# Patient Record
Sex: Male | Born: 2000 | Race: White | Hispanic: No | Marital: Single | Smoking: Current some day smoker
Health system: Southern US, Community
[De-identification: ages and names within clinical notes are randomized; demographics above are authoritative.]

## PROBLEM LIST (undated history)

## (undated) DIAGNOSIS — E119 Type 2 diabetes mellitus without complications: Secondary | ICD-10-CM

## (undated) DIAGNOSIS — J45909 Unspecified asthma, uncomplicated: Secondary | ICD-10-CM

## (undated) HISTORY — PX: WISDOM TOOTH EXTRACTION: SHX21

## (undated) HISTORY — PX: ROOT CANAL: SHX2363

---
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
## 2022-01-09 ENCOUNTER — Ambulatory Visit
Admission: EM | Admit: 2022-01-09 | Discharge: 2022-01-09 | Disposition: A | Discharge status: Home / Self Care | Payer: No Typology Code available for payment source | Attending: Physician Assistant | Admitting: Physician Assistant

## 2022-01-09 DIAGNOSIS — R112 Nausea with vomiting, unspecified: Secondary | ICD-10-CM

## 2022-01-09 MED ORDER — ONDANSETRON HCL 4 MG PO TABS: 4.0000 mg | ORAL_TABLET | Freq: Four times a day (QID) | ORAL | 0 refills | Status: AC

## 2022-01-09 NOTE — Discharge Instructions (Signed)
Can take Zofran as needed for nausea Recommend Imodium as needed for diarrhea Drink plenty of fluids, recommend gatorade zero Return if you develop new or worsening symptoms.

## 2022-01-09 NOTE — ED Triage Notes (Signed)
Pt c/o nausea, vomiting, diarrhea, abd pain   Onset ~ 4 days ago

## 2022-01-15 ENCOUNTER — Other Ambulatory Visit (HOSPITAL_COMMUNITY): Payer: Self-pay | Admitting: Psychiatry

## 2022-01-15 DIAGNOSIS — F331 Major depressive disorder, recurrent, moderate: Secondary | ICD-10-CM

## 2022-01-15 DIAGNOSIS — F411 Generalized anxiety disorder: Secondary | ICD-10-CM

## 2022-01-19 ENCOUNTER — Telehealth (HOSPITAL_COMMUNITY): Payer: No Typology Code available for payment source | Admitting: Psychiatry

## 2022-01-25 ENCOUNTER — Encounter: Payer: Self-pay | Admitting: Emergency Medicine

## 2022-01-25 ENCOUNTER — Ambulatory Visit
Admission: EM | Admit: 2022-01-25 | Discharge: 2022-01-25 | Disposition: A | Discharge status: Home / Self Care | Payer: No Typology Code available for payment source | Attending: Internal Medicine | Admitting: Internal Medicine

## 2022-01-25 DIAGNOSIS — R051 Acute cough: Secondary | ICD-10-CM | POA: Diagnosis present

## 2022-01-25 DIAGNOSIS — J069 Acute upper respiratory infection, unspecified: Secondary | ICD-10-CM | POA: Diagnosis present

## 2022-01-25 DIAGNOSIS — J029 Acute pharyngitis, unspecified: Secondary | ICD-10-CM | POA: Diagnosis present

## 2022-01-25 LAB — POCT RAPID STREP A (OFFICE): Rapid Strep A Screen: NEGATIVE

## 2022-01-25 MED ORDER — AMOXICILLIN-POT CLAVULANATE 875-125 MG PO TABS: 1.0000 | ORAL_TABLET | Freq: Two times a day (BID) | ORAL | 0 refills | Status: DC

## 2022-01-25 NOTE — Discharge Instructions (Signed)
Strep test was negative.  Throat culture is pending.  Will call if it is positive.  I am treating with an antibiotic to treat your upper respiratory infection and sore throat.  Please follow-up if symptoms persist or worsen.

## 2022-01-25 NOTE — ED Provider Notes (Signed)
[facility]    CSN: [id number] Arrival date & time: 01/25/22  1542      History   Chief Complaint Chief Complaint  Patient presents with   Sore Throat   Cough    congestion   Fatigue    HPI Ethan Montoya is a 21 y.o. male.   Patient presents with nasal congestion, cough, fatigue, sore throat.  Nasal congestion has been present for 1.5 weeks and other symptoms started about 4 days ago.  Patient reports that coworkers have tested positive for the flu.  He denies any known fevers at home.  Denies chest pain, shortness of breath, ear pain, nausea, vomiting, diarrhea, abdominal pain.  He has taken over-the-counter cold and flu medication with minimal improvement in symptoms.  Denies formal diagnosis of asthma but she does smoke cigarettes.   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 Authorizing Provider  amoxicillin-clavulanate (AUGMENTIN) 875-125 MG tablet Take 1 tablet by mouth every 12 (twelve) hours. 01/25/22  Yes Lavette Yankovich, Michele Rockers, FNP  Continuous Blood Gluc Receiver (DEXCOM G6 RECEIVER) DEVI 1 Device by Does not apply route daily as needed. 10/07/18   Hermenia Bers, NP  Continuous Blood Gluc Sensor (DEXCOM G6 SENSOR) MISC CHANGE SENSOR EVERY 10 DAYS 09/27/21    Hermenia Bers, NP  Continuous Blood Gluc Transmit (DEXCOM G6 TRANSMITTER) MISC CHANGE TRANSMITTER EVERY 90 DAYS 09/27/21   Hermenia Bers, NP  FLUoxetine (PROZAC) 10 MG capsule Take 1 capsule (10 mg total) by mouth daily. Take with 20 mg tab for a total of 30 mg daily 12/22/21 02/20/22  Vista Mink, MD  FLUoxetine (PROZAC) 20 MG capsule Take 1 capsule (20 mg total) by mouth daily. Take with 10 mg tab for a total of 30 mg daily 12/22/21 12/22/22  Vista Mink, MD  Insulin Disposable Pump (OMNIPOD 5 G6 INTRO, GEN 5,) KIT Inject 1 Device into the skin as directed. Change pod every 2 days. This will be a 30 day supply. Please fill for [facility] [id number] 10/18/20   Levon Hedger, MD  insulin glargine (LANTUS SOLOSTAR) 100 UNIT/ML Solostar Pen Inject up to 50 units per day as directed. Please keep on file for back up in case insulin pump malfunctions. 11/11/20   Levon Hedger, MD  insulin lispro (HUMALOG) 100 UNIT/ML injection Inject 300 units into pump every 48 hours 08/15/21   Hermenia Bers, NP  ondansetron (ZOFRAN) 4 MG tablet Take 1 tablet (4 mg total) by mouth every 6 (six) hours. 01/09/22   Ward, Lenise Arena, PA-C  cetirizine (ZYRTEC ALLERGY) 10 MG tablet Take 1 tablet (10 mg total) by mouth daily. Patient not taking: No sig reported 10/14/19 04/08/20  Jaynee Eagles, PA-C  ipratropium (ATROVENT) 0.06 % nasal spray Place 2 sprays into both nostrils 4 (four) times daily. Patient not taking: No sig reported 02/10/18 04/08/20  Ok Edwards, PA-C  NOVOPEN ECHO DEVI Use to inject insulin 4-5x day Patient not taking: No sig reported 11/07/18 04/08/20  Hermenia Bers, NP    Family History Family History  Problem Relation Age of Onset   Healthy Mother    Healthy Father     Social History Social History   Tobacco Use   Smoking status: Never    Passive exposure: Yes   Smokeless tobacco: Never  Vaping Use   Vaping Use: Never used  Substance Use Topics   Alcohol use: No   Drug use:  Never     Allergies   Patient has no known allergies.   Review of Systems Review of Systems Per HPI  Physical Exam Triage Vital Signs ED Triage Vitals  Enc Vitals Group     BP 01/25/22 1734 (!) 144/88     Pulse Rate 01/25/22 1734 82     Resp 01/25/22 1734 18     Temp 01/25/22 1742 98 F (36.7 C)     Temp src --      SpO2 01/25/22 1734 98 %     Weight --      Height --      Head Circumference --      Peak Flow --      Pain Score 01/25/22 1734 6     Pain Loc --      Pain Edu? --      Excl. in [place]? --    No data found.  Updated Vital Signs BP (!) 144/88   Pulse 82   Temp 98 F (36.7 C)   Resp 18   SpO2 98%   Visual Acuity Right Eye Distance:   Left Eye Distance:   Bilateral Distance:    Right Eye Near:   Left Eye Near:    Bilateral Near:     Physical Exam Constitutional:      General: He is not in acute distress.    Appearance: Normal appearance. He is not toxic-appearing or diaphoretic.  HENT:     Head: Normocephalic and atraumatic.     Right Ear: Tympanic membrane and ear canal normal.     Left Ear: Tympanic membrane and ear canal normal.     Nose: Congestion present.     Mouth/Throat:     Mouth: Mucous membranes are moist.     Pharynx: Posterior oropharyngeal erythema present. No pharyngeal swelling or oropharyngeal exudate.     Tonsils: No tonsillar exudate or tonsillar abscesses. 1+ on the right. 1+ on the left.  Eyes:     Extraocular Movements: Extraocular movements intact.     Conjunctiva/sclera: Conjunctivae normal.     Pupils: Pupils are equal, round, and reactive to light.  Cardiovascular:     Rate and Rhythm: Normal rate and regular rhythm.     Pulses: Normal pulses.     Heart sounds: Normal heart sounds.  Pulmonary:     Effort: Pulmonary effort is normal. No respiratory distress.     Breath sounds: Normal breath sounds. No stridor. No wheezing, rhonchi or rales.  Abdominal:     General: Abdomen is flat. Bowel sounds are normal.      Palpations: Abdomen is soft.  Musculoskeletal:        General: Normal range of motion.     Cervical back: Normal range of motion.  Skin:    General: Skin is warm and dry.  Neurological:     General: No focal deficit present.     Mental Status: He is alert and oriented to person, place, and time. Mental status is at baseline.  Psychiatric:        Mood and Affect: Mood normal.        Behavior: Behavior normal.      UC Treatments / Results  Labs (all labs ordered are listed, but only abnormal results are displayed) Labs Reviewed  CULTURE, GROUP A STREP [facility])  POCT RAPID STREP A (OFFICE)    EKG   Radiology No results found.  Procedures Procedures (including critical care time)  Medications Ordered in UC Medications - No data to display  Initial Impression / Assessment and Plan / UC Course  I have reviewed the triage vital signs and the nursing notes.  Pertinent labs & imaging results that were available during my care of the patient were reviewed by me and considered in my medical decision making (see chart for details).     Rapid strep is negative.  Throat culture pending.  Given duration of nasal congestion and acute tonsillitis on exam, will opt to treat with antibiotic for concern for secondary bacterial infection.  Discussed with patient concern that a viral illness could be cause of symptoms as well.  Do not think viral testing is necessary given duration of symptoms as it would not change treatment.  Discussed supportive care and symptom management with patient.  Discussed return precautions.  Patient verbalized understanding and was agreeable with plan. Final Clinical Impressions(s) / UC Diagnoses   Final diagnoses:  Acute upper respiratory infection  Sore throat  Acute cough     Discharge Instructions      Strep test was negative.  Throat culture is pending.  Will call if it is positive.  I am treating with an antibiotic to treat your upper respiratory  infection and sore throat.  Please follow-up if symptoms persist or worsen.     ED Prescriptions     Medication Sig Dispense Auth. Provider   amoxicillin-clavulanate (AUGMENTIN) 875-125 MG tablet Take 1 tablet by mouth every 12 (twelve) hours. 14 tablet [place], Michele Rockers, [place]      PDMP not reviewed this encounter.   Teodora Medici, [place] 01/25/22 [phone]

## 2022-01-25 NOTE — ED Triage Notes (Signed)
Pt is present today with c/o cough, congestion, sore throat, and fatigue. Pt states his cough started one week ago and sore throat started x2 days ago.

## 2022-01-29 LAB — CULTURE, GROUP A STREP (THRC)

## 2022-02-01 ENCOUNTER — Ambulatory Visit (INDEPENDENT_AMBULATORY_CARE_PROVIDER_SITE_OTHER): Payer: No Typology Code available for payment source | Admitting: Family

## 2022-02-01 ENCOUNTER — Encounter (INDEPENDENT_AMBULATORY_CARE_PROVIDER_SITE_OTHER): Payer: Self-pay

## 2022-02-01 NOTE — Progress Notes (Deleted)
Pediatric Endocrinology Diabetes Consultation Follow-up Visit  [name] 06-27-2000 [id number]  Chief Complaint: Follow-up type 1 diabetes   Ethan Cory, MD   HPI: Ethan Montoya  is a 21 y.o. male presenting for follow-up of type 1 diabetes. he is accompanied to this visit by his Mother .  Ethan Montoya presented to the ED on 07/20/16 with 2 days of increased polyuria/polydipsia and acute weight loss. He was on his second steroid burst for apparent contact dermatitis secondary to poison ivy. His mother has type 1 diabetes and recognized symptoms of diabetes. BG was 1,043 mg/dL without acidosis. There was >500 glucose in the urine but no ketones. He was admitted to the PICU for fluid resuscitation prior to initiation of insulin per hyperglycemia, non ketotic protocol.  In the PICU he had an observed seizure episode. He had a stat CT which was negative. They then thought perhaps seizure was a vagal response. He was transferred to the ward the next day. BG decreased with hydration to 400 and he was started on two component insulin with Lantus and Novolog at that time. 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   2. Since last visit to [facility] on 10/2021 , he has been well.  No ER visits or hospitalizations.  He has been busy with work and is considering going back to school. He will have hunting season coming up which will increase his activity.   Feels like diabetes has been going ok with Omnipod and Dexcom CGM. Blood sugars have been running higher now that it is not as hot out. He has been doing override boluses because he found his settings were not allowing him to give strong enough doses Hypoglycemia has been rare. No severe hypoglycemia.     Insulin regimen:  Omnipod 5   If using injections --> 5 units of Lantus. Novolog 150/50/15 plan  Basal (Max: 0.6 units/hr) 12AM 0.45                         Total: 10.8 units per day    Insulin to carbohydrate ratio (ICR)  12AM 25  9AM 17   12PM  15   6PM 12   9PM 15        Max Bolus: 10 units   Insulin Sensitivity Factor (ISF) 12AM 55                              Target BG & Correct Above BG 12AM 110                            Hypoglycemia: Able to feel low blood sugars.  No glucagon needed recently.  Blood glucose download: Did not bring meter  Omnipod Insulin pump downlaod    Med-alert ID: Not currently wearing. Injection sites: legs and arms  Annual labs due: 04/2022 Ophthalmology due: Due. Discussed today     3. ROS: Greater than 10 systems reviewed with pertinent positives listed in HPI, otherwise neg. Constitutional: Sleeping well.  Eyes: No changes in vision. No blurry vision  Ears/Nose/Mouth/Throat: No difficulty swallowing. Cardiovascular: No palpitations. No chest pain  Respiratory: No increased work of breathing. No SOB  Gastrointestinal: No constipation or diarrhea. No abdominal pain Genitourinary: No nocturia, no polyuria Neurologic: Normal sensation, no tremor Endocrine: No polydipsia.  No hyperpigmentation Psychiatric: Normal affect  Past Medical History:   Past Medical History:  Diagnosis Date   Asthma    Diabetes mellitus without complication ([place])     Medications:  Outpatient Encounter Medications as of 02/01/2022  Medication Sig   amoxicillin-clavulanate (AUGMENTIN) 875-125 MG tablet Take 1 tablet by mouth every 12 (twelve) hours.   Continuous Blood Gluc Receiver (DEXCOM G6 RECEIVER) DEVI 1 Device by Does not apply route daily as needed.   Continuous Blood Gluc Sensor (DEXCOM G6 SENSOR) MISC CHANGE SENSOR EVERY 10 DAYS   Continuous Blood Gluc Transmit (DEXCOM G6 TRANSMITTER) MISC CHANGE TRANSMITTER EVERY 90 DAYS   FLUoxetine (PROZAC) 10 MG capsule Take 1 capsule (10 mg total) by mouth daily. Take with 20 mg tab for a total of 30 mg daily   FLUoxetine (PROZAC) 20 MG capsule Take 1 capsule (20 mg total) by mouth daily. Take with 10 mg tab for a total of 30 mg daily   Insulin  Disposable Pump (OMNIPOD 5 G6 INTRO, GEN 5,) KIT Inject 1 Device into the skin as directed. Change pod every 2 days. This will be a 30 day supply. Please fill for [facility] [id number]   insulin glargine (LANTUS SOLOSTAR) 100 UNIT/ML Solostar Pen Inject up to 50 units per day as directed. Please keep on file for back up in case insulin pump malfunctions.   insulin lispro (HUMALOG) 100 UNIT/ML injection Inject 300 units into pump every 48 hours   ondansetron (ZOFRAN) 4 MG tablet Take 1 tablet (4 mg total) by mouth every 6 (six) hours.   [DISCONTINUED] cetirizine (ZYRTEC ALLERGY) 10 MG tablet Take 1 tablet (10 mg total) by mouth daily. (Patient not taking: No sig reported)   [DISCONTINUED] ipratropium (ATROVENT) 0.06 % nasal spray Place 2 sprays into both nostrils 4 (four) times daily. (Patient not taking: No sig reported)   [DISCONTINUED] NOVOPEN ECHO DEVI Use to inject insulin 4-5x day (Patient not taking: No sig reported)   No facility-administered encounter medications on file as of 02/01/2022.    Allergies: No Known Allergies  Surgical History: Past Surgical History:  Procedure Laterality Date   ROOT CANAL     WISDOM TOOTH EXTRACTION      Family History:  Mother has Type 1 diabetes diagnosed at age 19.    Social History: Lives with: Parents and sister   Graduated. [employer] freshman   Physical Exam:  There were no vitals filed for this visit.      There were no vitals taken for this visit. Body mass index: body mass index is unknown because there is no height or weight on file. Blood pressure %iles are not available for patients who are 18 years or older.  Ht Readings from Last 3 Encounters:  04/08/20 5' 11.26" (1.81 m) (75 %, Z= 0.67)*  01/08/20 5' 11.1" (1.806 m) (74 %, Z= 0.63)*  10/06/19 5' 10.87" (1.8 m) (72 %, Z= 0.57)*   * Growth percentiles are based on CDC (Boys, 2-20 Years) data.   Wt Readings from Last 3 Encounters:  11/02/21 152 lb 6.4 oz (69.1 kg) (46 %, Z=  -0.10)*  07/26/21 149 lb 12.8 oz (67.9 kg) (43 %, Z= -0.17)*  04/21/21 142 lb 6 oz (64.6 kg) (32 %, Z= -0.47)*   * Growth percentiles are based on CDC (Boys, 2-20 Years) data.   Physical Exam  General: Well developed, well nourished male in no acute distress. Head: Normocephalic, atraumatic.   Eyes:  Pupils equal and round. EOMI.  Sclera white.  No eye drainage.   Ears/Nose/Mouth/Throat: Nares patent, no nasal drainage.  Normal dentition, mucous membranes moist.  Neck: supple, no cervical lymphadenopathy, no thyromegaly Cardiovascular: regular rate, normal S1/S2, no murmurs Respiratory: No increased work of breathing.  Lungs clear to auscultation bilaterally.  No wheezes. Abdomen: soft, nontender, nondistended. Normal bowel sounds.  No appreciable masses  Extremities: warm, well perfused, cap refill < 2 sec.   Musculoskeletal: Normal muscle mass.  Normal strength Skin: warm, dry.  No rash or lesions. Neurologic: alert and oriented, normal speech, no tremor    Labs:  Results for orders placed or performed during the hospital encounter of 01/25/22  Culture, group A strep   Specimen: Throat  Result Value Ref Range   Specimen Description THROAT    Special Requests NONE    Culture      FEW STREPTOCOCCUS,BETA HEMOLYTIC NOT GROUP A Beta hemolytic streptococci are predictably susceptible to penicillin and other beta lactams. Susceptibility testing not routinely performed. Performed at [facility], [place] [address] [place], [place] [address]    Report Status 01/29/2022 FINAL   POCT rapid strep A  Result Value Ref Range   Rapid Strep A Screen Negative Negative     Assessment/Plan: Laval is a 21 y.o. male with type 1 diabetes recently started on Omnipod 5 insulin pump and Dexcom CGM. He is having more hyperglycemia, needs adjustments to insulin pump settings. Hemoglobin A1c is 7.4% which is higher then ADA goal of <7%.   1. Type 1 Diabetes  ([place]) 2. Hyperglycemia - Reviewed  insulin pump and CGM download. Discussed trends and patterns.  - Rotate pump sites to prevent scar tissue.  - bolus 15 minutes prior to eating to limit blood sugar spikes.  - Reviewed carb counting and importance of accurate carb counting.  - Discussed signs and symptoms of hypoglycemia. Always have glucose available.  - POCT glucose and hemoglobin A1c  - Reviewed growth chart.    3. Insulin pump titration  Basal (Max: 0.6 units/hr) 12AM 0.40 --> 0.45                         Total: 10.8 units per day    Insulin to carbohydrate ratio (ICR)  12AM 25  9AM 20 --> 17   12PM 18--> 15   6PM 13 --> 12   9PM 15        Max Bolus: 10 units   Insulin Sensitivity Factor (ISF) 12AM 65 --> 55                               Follow-up: 3 months.   LOS: >45 spent today reviewing the medical chart, counseling the patient/family, and documenting today's visit.    When a patient is on insulin, intensive monitoring of blood glucose levels is necessary to avoid hyperglycemia and hypoglycemia. Severe hyperglycemia/hypoglycemia can lead to hospital admissions and be life threatening.     Hermenia Bers,  FNP-C  Pediatric Specialist  [address] [place]  [place], [address]  Tele: [phone]

## 2022-03-02 ENCOUNTER — Ambulatory Visit (INDEPENDENT_AMBULATORY_CARE_PROVIDER_SITE_OTHER): Payer: No Typology Code available for payment source | Admitting: Family

## 2022-03-02 ENCOUNTER — Encounter (INDEPENDENT_AMBULATORY_CARE_PROVIDER_SITE_OTHER): Payer: Self-pay | Admitting: Family

## 2022-03-02 VITALS — BP 116/70 | HR 86 | Wt 147.0 lb

## 2022-03-02 DIAGNOSIS — E1065 Type 1 diabetes mellitus with hyperglycemia: Secondary | ICD-10-CM | POA: Diagnosis not present

## 2022-03-02 DIAGNOSIS — Z4681 Encounter for fitting and adjustment of insulin pump: Secondary | ICD-10-CM

## 2022-03-02 LAB — POCT GLUCOSE (DEVICE FOR HOME USE): POC Glucose: 127 mg/dl — AB (ref 70–99)

## 2022-03-02 LAB — POCT GLYCOSYLATED HEMOGLOBIN (HGB A1C): Hemoglobin A1C: 7 % — AB (ref 4.0–5.6)

## 2022-03-02 NOTE — Patient Instructions (Signed)
Insulin to carbohydrate ratio (ICR)  12AM 25  9AM 17   11PM 15 --> 20   5PM 12   9PM 15 --> 12        Max Bolus: 14 units  It was a pleasure seeing you in clinic today. Please do not hesitate to contact me if you have questions or concerns.   Please sign up for MyChart. This is a communication tool that allows you to send an email directly to me. This can be used for questions, prescriptions and blood sugar reports. We will also release labs to you with instructions on MyChart. Please do not use MyChart if you need immediate or emergency assistance. Ask our wonderful front office staff if you need assistance.

## 2022-03-02 NOTE — Progress Notes (Signed)
Pediatric Endocrinology Diabetes Consultation Follow-up Visit  Ethan Montoya 12-11-2000 [id number]  Chief Complaint: Follow-up type 1 diabetes   Alba Cory, MD   HPI: Ethan Montoya  is a 22 y.o. male presenting for follow-up of type 1 diabetes. he is accompanied to this visit by his Mother .  Ethan Montoya presented to the ED on 07/20/16 with 2 days of increased polyuria/polydipsia and acute weight loss. He was on his second steroid burst for apparent contact dermatitis secondary to poison ivy. His mother has type 1 diabetes and recognized symptoms of diabetes. BG was 1,043 mg/dL without acidosis. There was >500 glucose in the urine but no ketones. He was admitted to the PICU for fluid resuscitation prior to initiation of insulin per hyperglycemia, non ketotic protocol.  In the PICU he had an observed seizure episode. He had a stat CT which was negative. They then thought perhaps seizure was a vagal response. He was transferred to the ward the next day. BG decreased with hydration to 400 and he was started on two component insulin with Lantus and Novolog at that time. 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   2. Since last visit to [facility] on 10/2021 , he has been well.  No ER visits or hospitalizations.  Using Omnipod insulin pump and Dexcom CGM. Reports both have been working well. He boluses before eating most of the time. Carb intake is usually 45-90 grams per meal. He cuts back carbs that he enters at lunch to prevent hypoglycemia. No severe hypoglycemia, no glucagon needed.   Concerns:  - Lows after lunch.   Insulin regimen:  Omnipod 5   If using injections --> 5 units of Lantus. Novolog 150/50/15 plan  Basal (Max: 0.6 units/hr) 12AM 0.45                         Total: 10.8 units per day    Insulin to carbohydrate ratio (ICR)  12AM 25  9AM 17   12PM 15   6PM 12   9PM 15        Max Bolus: 14 units   Insulin Sensitivity Factor (ISF) 12AM 55                               Target BG & Correct Above BG 12AM 110                            Hypoglycemia: Able to feel low blood sugars.  No glucagon needed recently.  Blood glucose download: Did not bring meter  Omnipod Insulin pump downlaod    Med-alert ID: Not currently wearing. Injection sites: legs and arms  Annual labs due: 04/2022 Ophthalmology due: Due. Discussed today     3. ROS: Greater than 10 systems reviewed with pertinent positives listed in HPI, otherwise neg. Constitutional: Sleeping well.  Eyes: No changes in vision. No blurry vision  Ears/Nose/Mouth/Throat: No difficulty swallowing. Cardiovascular: No palpitations. No chest pain  Respiratory: No increased work of breathing. No SOB  Gastrointestinal: No constipation or diarrhea. No abdominal pain Genitourinary: No nocturia, no polyuria Neurologic: Normal sensation, no tremor Endocrine: No polydipsia.  No hyperpigmentation Psychiatric: Normal affect  Past Medical History:   Past Medical History:  Diagnosis Date   Asthma    Diabetes mellitus without complication (Ethan Montoya)     Medications:  Outpatient Encounter Medications as of 03/02/2022  Medication Sig   Continuous Blood Gluc Receiver (DEXCOM G6 RECEIVER) DEVI 1 Device by Does not apply route daily as needed.   Continuous Blood Gluc Sensor (DEXCOM G6 SENSOR) MISC CHANGE SENSOR EVERY 10 DAYS   Continuous Blood Gluc Transmit (DEXCOM G6 TRANSMITTER) MISC CHANGE TRANSMITTER EVERY 90 DAYS   FLUoxetine (PROZAC) 20 MG capsule Take 1 capsule (20 mg total) by mouth daily. Take with 10 mg tab for a total of 30 mg daily   Insulin Disposable Pump (OMNIPOD 5 G6 INTRO, GEN 5,) KIT Inject 1 Device into the skin as directed. Change pod every 2 days. This will be a 30 day supply. Please fill for Ethan Montoya [id number]   insulin lispro (HUMALOG) 100 UNIT/ML injection Inject 300 units into pump every 48 hours   amoxicillin-clavulanate (AUGMENTIN) 875-125 MG tablet Take 1 tablet by mouth every 12 (twelve)  hours. (Patient not taking: Reported on 03/02/2022)   FLUoxetine (PROZAC) 10 MG capsule Take 1 capsule (10 mg total) by mouth daily. Take with 20 mg tab for a total of 30 mg daily   insulin glargine (LANTUS SOLOSTAR) 100 UNIT/ML Solostar Pen Inject up to 50 units per day as directed. Please keep on file for back up in case insulin pump malfunctions. (Patient not taking: Reported on 03/02/2022)   ondansetron (ZOFRAN) 4 MG tablet Take 1 tablet (4 mg total) by mouth every 6 (six) hours. (Patient not taking: Reported on 03/02/2022)   [DISCONTINUED] cetirizine (ZYRTEC ALLERGY) 10 MG tablet Take 1 tablet (10 mg total) by mouth daily. (Patient not taking: No sig reported)   [DISCONTINUED] ipratropium (ATROVENT) 0.06 % nasal spray Place 2 sprays into both nostrils 4 (four) times daily. (Patient not taking: No sig reported)   [DISCONTINUED] NOVOPEN ECHO DEVI Use to inject insulin 4-5x day (Patient not taking: No sig reported)   No facility-administered encounter medications on file as of 03/02/2022.    Allergies: No Known Allergies  Surgical History: Past Surgical History:  Procedure Laterality Date   ROOT CANAL     WISDOM TOOTH EXTRACTION      Family History:  Mother has Type 1 diabetes diagnosed at age 23.    Social History: Lives with: Parents and sister   Graduated. Works as a Actor    Physical Exam:  Vitals:   03/02/22 0935  BP: 116/70  Pulse: 86  Weight: 147 lb (66.7 kg)     BP 116/70   Pulse 86   Wt 147 lb (66.7 kg)   BMI 20.35 kg/m  Body mass index: body mass index is 20.35 kg/m. Growth %ile SmartLinks can only be used for patients less than 24 years old.  Ht Readings from Last 3 Encounters:  04/08/20 5' 11.26" (1.81 m) (75 %, Z= 0.67)*  01/08/20 5' 11.1" (1.806 m) (74 %, Z= 0.63)*  10/06/19 5' 10.87" (1.8 m) (72 %, Z= 0.57)*   * Growth percentiles are based on CDC (Boys, 2-20 Years) data.   Wt Readings from Last 3 Encounters:  03/02/22 147 lb (66.7 kg)  11/02/21  152 lb 6.4 oz (69.1 kg) (46 %, Z= -0.10)*  07/26/21 149 lb 12.8 oz (67.9 kg) (43 %, Z= -0.17)*   * Growth percentiles are based on CDC (Boys, 2-20 Years) data.   Physical Exam  General: Well developed, well nourished male in no acute distress. Head: Normocephalic, atraumatic.   Eyes:  Pupils equal and round. EOMI.  Sclera white.  No eye drainage.   Ears/Nose/Mouth/Throat: Nares patent, no nasal drainage.  Normal dentition, mucous membranes moist.  Neck: supple, no cervical lymphadenopathy, no thyromegaly Cardiovascular: regular rate, normal S1/S2, no murmurs Respiratory: No increased work of breathing.  Lungs clear to auscultation bilaterally.  No wheezes. Abdomen: soft, nontender, nondistended. Normal bowel sounds.  No appreciable masses  Extremities: warm, well perfused, cap refill < 2 sec.   Musculoskeletal: Normal muscle mass.  Normal strength Skin: warm, dry.  No rash or lesions. Neurologic: alert and oriented, normal speech, no tremor    Labs:  Results for orders placed or performed in visit on 03/02/22  POCT glycosylated hemoglobin (Hb A1C)  Result Value Ref Range   Hemoglobin A1C 7.0 (A) 4.0 - 5.6 %   HbA1c POC (<> result, manual entry)     HbA1c, POC (prediabetic range)     HbA1c, POC (controlled diabetic range)    POCT Glucose (Device for Home Use)  Result Value Ref Range   Glucose Fasting, POC     POC Glucose 127 (A) 70 - 99 mg/dl     Assessment/Plan: Ethan Montoya is a 21 y.o. male with type 1 diabetes recently started on Omnipod 5 insulin pump and Dexcom CGM. Will reduce his carb ratio at lunch to reduce post prandial hyperglycemia. Hemoglobin A1c has improved to 7%. His time in target range is 63%.   1. Type 1 Diabetes  ([place]) 2. Hyperglycemia - Reviewed insulin pump and CGM download. Discussed trends and patterns.  - Rotate pump sites to prevent scar tissue.  - bolus 15 minutes prior to eating to limit blood sugar spikes.  - Reviewed carb counting and importance of  accurate carb counting.  - Discussed signs and symptoms of hypoglycemia. Always have glucose available.  - POCT glucose and hemoglobin A1c  - Reviewed growth chart.  - Discussed update to Omnipod system with new app soon to be released to his cell phone.   3. Insulin pump titration   Insulin to carbohydrate ratio (ICR)  12AM 25  9AM 17   11PM 15 --> 20   5PM 12   9PM 15 --> 12        Max Bolus: 14 units   Follow-up: 3 months.   LOS: >40  spent today reviewing the medical chart, counseling the patient/family, and documenting today's visit.  When a patient is on insulin, intensive monitoring of blood glucose levels is necessary to avoid hyperglycemia and hypoglycemia. Severe hyperglycemia/hypoglycemia can lead to [facility] admissions and be life threatening.     Hermenia Bers,  FNP-C  Pediatric Specialist  [address] [place]  [place], [address]  Tele: [phone]

## 2022-03-16 ENCOUNTER — Encounter (HOSPITAL_COMMUNITY): Payer: No Typology Code available for payment source | Admitting: Psychiatry

## 2022-03-16 ENCOUNTER — Encounter (HOSPITAL_COMMUNITY): Payer: Self-pay

## 2022-03-16 NOTE — Progress Notes (Signed)
This encounter was created in error - please disregard.

## 2022-04-26 ENCOUNTER — Other Ambulatory Visit (HOSPITAL_COMMUNITY): Payer: Self-pay | Admitting: Psychiatry

## 2022-04-26 DIAGNOSIS — F331 Major depressive disorder, recurrent, moderate: Secondary | ICD-10-CM

## 2022-04-26 DIAGNOSIS — F411 Generalized anxiety disorder: Secondary | ICD-10-CM

## 2022-04-30 ENCOUNTER — Other Ambulatory Visit (HOSPITAL_COMMUNITY): Payer: Self-pay | Admitting: Psychiatry

## 2022-04-30 DIAGNOSIS — F331 Major depressive disorder, recurrent, moderate: Secondary | ICD-10-CM

## 2022-06-01 ENCOUNTER — Ambulatory Visit (INDEPENDENT_AMBULATORY_CARE_PROVIDER_SITE_OTHER): Payer: No Typology Code available for payment source | Admitting: Family

## 2022-06-01 ENCOUNTER — Encounter (INDEPENDENT_AMBULATORY_CARE_PROVIDER_SITE_OTHER): Payer: Self-pay | Admitting: Family

## 2022-06-01 VITALS — BP 118/72 | HR 84 | Wt 142.8 lb

## 2022-06-01 DIAGNOSIS — E109 Type 1 diabetes mellitus without complications: Secondary | ICD-10-CM

## 2022-06-01 DIAGNOSIS — Z9641 Presence of insulin pump (external) (internal): Secondary | ICD-10-CM

## 2022-06-01 DIAGNOSIS — E1065 Type 1 diabetes mellitus with hyperglycemia: Secondary | ICD-10-CM

## 2022-06-01 LAB — POCT GLYCOSYLATED HEMOGLOBIN (HGB A1C): HbA1c POC (<> result, manual entry): 7.1 % (ref 4.0–5.6)

## 2022-06-01 LAB — POCT GLUCOSE (DEVICE FOR HOME USE): Glucose Fasting, POC: 111 mg/dL — AB (ref 70–99)

## 2022-06-01 MED ORDER — DEXCOM G6 SENSOR MISC: 1 refills | Status: DC

## 2022-06-01 NOTE — Progress Notes (Signed)
Pediatric Endocrinology Diabetes Consultation Follow-up Visit  NAQUAN GARMAN [birth year] [id number]  Chief Complaint: Follow-up type 1 diabetes   Velvet Bathe, MD   HPI: Wilfred  is a 22 y.o. male presenting for follow-up of type 1 diabetes. he is accompanied to this visit by his Mother .  1. Seab presented to the ED on 07/20/16 with 2 days of increased polyuria/polydipsia and acute weight loss. He was on his second steroid burst for apparent contact dermatitis secondary to poison ivy. His mother has type 1 diabetes and recognized symptoms of diabetes. BG was 1,043 mg/dL without acidosis. There was >500 glucose in the urine but no ketones. He was admitted to the PICU for fluid resuscitation prior to initiation of insulin per hyperglycemia, non ketotic protocol.  In the PICU he had an observed seizure episode. He had a stat CT which was negative. They then thought perhaps seizure was a vagal response. He was transferred to the ward the next day. BG decreased with hydration to 400 and he was started on two component insulin with Lantus and Novolog at that time. 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   2. Since last visit to [facility] on 02/2022 , he has been well.  No ER visits or hospitalizations.  He stopped Prozac for about 1 month and reports he struggled with depression and anxiety while being off of it which affected his diabetes. He restarted about 1 week ago. Using Omnipod insulin pump and Dexcom G6. Rarely has pod failures. He has not been bolusing often. Blood sugars have been running higher. Hypoglycemia has been rare, he is able to feel symptoms when low.    Insulin regimen:  Omnipod 5   If using injections --> 10 units of Lantus. Novolog 150/50/15 plan  Basal (Max: 0.6 units/hr) 12AM 0.45                         Total: 10.8 units per day    Insulin to carbohydrate ratio (ICR)  12AM 25  9AM 17   11PM 20   5PM 12   9PM 12        Max Bolus: 14 units   Insulin  Sensitivity Factor (ISF) 12AM 55                              Target BG & Correct Above BG 12AM 110                            Hypoglycemia: Able to feel low blood sugars.  No glucagon needed recently.  Blood glucose download: Did not bring meter  Omnipod Insulin pump downlaod    Med-alert ID: Not currently wearing. Injection sites: legs and arms  Annual labs due: 04/2022--> ordered  Ophthalmology due: Due. Discussed today     3. ROS: Greater than 10 systems reviewed with pertinent positives listed in HPI, otherwise neg. Constitutional: Sleeping well. 5 lbs weight loss.  Eyes: No changes in vision. No blurry vision  Ears/Nose/Mouth/Throat: No difficulty swallowing. Cardiovascular: No palpitations. No chest pain  Respiratory: No increased work of breathing. No SOB  Gastrointestinal: No constipation or diarrhea. No abdominal pain Genitourinary: No nocturia, no polyuria Neurologic: Normal sensation, no tremor Endocrine: No polydipsia.  No hyperpigmentation Psychiatric: Normal affect  Past Medical History:   Past Medical History:  Diagnosis Date   Asthma    Diabetes  mellitus without complication     Medications:  Outpatient Encounter Medications as of 06/01/2022  Medication Sig   Continuous Blood Gluc Receiver (DEXCOM G6 RECEIVER) DEVI 1 Device by Does not apply route daily as needed.   Continuous Blood Gluc Sensor (DEXCOM G6 SENSOR) MISC CHANGE SENSOR EVERY 10 DAYS   Continuous Blood Gluc Transmit (DEXCOM G6 TRANSMITTER) MISC CHANGE TRANSMITTER EVERY 90 DAYS   FLUoxetine (PROZAC) 10 MG capsule Take 1 capsule (10 mg total) by mouth daily. Take with 20 mg tab for a total of 30 mg daily   FLUoxetine (PROZAC) 20 MG capsule Take 1 capsule (20 mg total) by mouth daily. Take with 10 mg tab for a total of 30 mg daily   Insulin Disposable Pump (OMNIPOD 5 G6 INTRO, GEN 5,) KIT Inject 1 Device into the skin as directed. Change pod every 2 days. This will be a 30 day supply.  Please fill for [facility] [id number]   insulin lispro (HUMALOG) 100 UNIT/ML injection Inject 300 units into pump every 48 hours   amoxicillin-clavulanate (AUGMENTIN) 875-125 MG tablet Take 1 tablet by mouth every 12 (twelve) hours. (Patient not taking: Reported on 03/02/2022)   insulin glargine (LANTUS SOLOSTAR) 100 UNIT/ML Solostar Pen Inject up to 50 units per day as directed. Please keep on file for back up in case insulin pump malfunctions. (Patient not taking: Reported on 03/02/2022)   ondansetron (ZOFRAN) 4 MG tablet Take 1 tablet (4 mg total) by mouth every 6 (six) hours. (Patient not taking: Reported on 03/02/2022)   [DISCONTINUED] cetirizine (ZYRTEC ALLERGY) 10 MG tablet Take 1 tablet (10 mg total) by mouth daily. (Patient not taking: No sig reported)   [DISCONTINUED] ipratropium (ATROVENT) 0.06 % nasal spray Place 2 sprays into both nostrils 4 (four) times daily. (Patient not taking: No sig reported)   [DISCONTINUED] NOVOPEN ECHO DEVI Use to inject insulin 4-5x day (Patient not taking: No sig reported)   No facility-administered encounter medications on file as of 06/01/2022.    Allergies: No Known Allergies  Surgical History: Past Surgical History:  Procedure Laterality Date   ROOT CANAL     WISDOM TOOTH EXTRACTION      Family History:  Mother has Type 1 diabetes diagnosed at age 71.    Social History: Lives with: Parents and sister   Graduated. Works as a Actor    Physical Exam:  Vitals:   06/01/22 1106  BP: 118/72  Pulse: 84  Weight: 142 lb 12.8 oz (64.8 kg)      BP 118/72 (BP Location: Left Arm, Patient Position: Sitting, Cuff Size: Large)   Pulse 84   Wt 142 lb 12.8 oz (64.8 kg)   BMI 19.77 kg/m  Body mass index: body mass index is 19.77 kg/m. Growth %ile SmartLinks can only be used for patients less than 72 years old.  Ht Readings from Last 3 Encounters:  04/08/20 5' 11.26" (1.81 m) (75 %, Z= 0.67)*  01/08/20 5' 11.1" (1.806 m) (74 %, Z= 0.63)*  10/06/19  5' 10.87" (1.8 m) (72 %, Z= 0.57)*   * Growth percentiles are based on CDC (Boys, 2-20 Years) data.   Wt Readings from Last 3 Encounters:  06/01/22 142 lb 12.8 oz (64.8 kg)  03/02/22 147 lb (66.7 kg)  11/02/21 152 lb 6.4 oz (69.1 kg) (46 %, Z= -0.10)*   * Growth percentiles are based on CDC (Boys, 2-20 Years) data.   Physical Exam  General: Well developed, well nourished male in no acute distress.  Head: Normocephalic, atraumatic.   Eyes:  Pupils equal and round. EOMI.  Sclera white.  No eye drainage.   Ears/Nose/Mouth/Throat: Nares patent, no nasal drainage.  Normal dentition, mucous membranes moist.  Neck: supple, no cervical lymphadenopathy, no thyromegaly Cardiovascular: regular rate, normal S1/S2, no murmurs Respiratory: No increased work of breathing.  Lungs clear to auscultation bilaterally.  No wheezes. Abdomen: soft, nontender, nondistended. Normal bowel sounds.  No appreciable masses  Extremities: warm, well perfused, cap refill < 2 sec.   Musculoskeletal: Normal muscle mass.  Normal strength Skin: warm, dry.  No rash or lesions. Neurologic: alert and oriented, normal speech, no tremor    Labs:  Results for orders placed or performed in visit on 06/01/22  POCT glycosylated hemoglobin (Hb A1C)  Result Value Ref Range   Hemoglobin A1C     HbA1c POC (<> result, manual entry) 7.1 4.0 - 5.6 %   HbA1c, POC (prediabetic range)     HbA1c, POC (controlled diabetic range)    POCT Glucose (Device for Home Use)  Result Value Ref Range   Glucose Fasting, POC 111 (A) 70 - 99 mg/dL   POC Glucose       Assessment/Plan: Padraic is a 22 y.o. male with type 1 diabetes recently started on Omnipod 5 insulin pump and Dexcom CGM. He has not been bolusing consistently which occasionally leads to hyperglycemia. However, his hemoglobin A1c is 7.1% today and time in target range is 56%.   1. Type 1 Diabetes  ([facility]) 2. Hyperglycemia - Reviewed insulin pump and CGM download. Discussed  trends and patterns.  - Rotate pump sites to prevent scar tissue.  - bolus 15 minutes prior to eating to limit blood sugar spikes.  - Reviewed carb counting and importance of accurate carb counting.  - Discussed signs and symptoms of hypoglycemia. Always have glucose available.  - POCT glucose and hemoglobin A1c  - Reviewed growth chart.  - Discussed updates to insulin pump therapy including OMnipod 5 app for iphone and Dexcom G7  - Labs; Lipid panel, TSH, FT4, microalbumin ordered.   3. Insulin pump titration  No changes today.   Follow-up: 3 months.   LOS: >40  spent today reviewing the medical chart, counseling the patient/family, and documenting today's visit.   When a patient is on insulin, intensive monitoring of blood glucose levels is necessary to avoid hyperglycemia and hypoglycemia. Severe hyperglycemia/hypoglycemia can lead to hospital admissions and be life threatening.     Gretchen Short,  FNP-C  Pediatric Specialist  [address]  [place], [address]  Tele: [phone]

## 2022-06-01 NOTE — Patient Instructions (Signed)
It was a pleasure seeing you in clinic today. Please do not hesitate to contact me if you have questions or concerns.  ° °Please sign up for MyChart. This is a communication tool that allows you to send an email directly to me. This can be used for questions, prescriptions and blood sugar reports. We will also release labs to you with instructions on MyChart. Please do not use MyChart if you need immediate or emergency assistance. Ask our wonderful front office staff if you need assistance.  ° °

## 2022-06-02 LAB — LIPID PANEL
Cholesterol: 188 mg/dL (ref <200)
HDL: 41 mg/dL (ref >=40)
LDL Cholesterol (Calc): 132 mg/dL (calc) — ABNORMAL HIGH
Non-HDL Cholesterol (Calc): 147 mg/dL (calc) — ABNORMAL HIGH (ref <130)
Total CHOL/HDL Ratio: 4.6 (calc) (ref <5.0)
Triglycerides: 59 mg/dL (ref <150)

## 2022-06-02 LAB — COMPLETE METABOLIC PANEL WITH GFR
AG Ratio: 2.1 (calc) (ref 1.0–2.5)
ALT: 12 U/L (ref 9–46)
AST: 12 U/L (ref 10–40)
Albumin: 4.8 g/dL (ref 3.6–5.1)
Alkaline phosphatase (APISO): 79 U/L (ref 36–130)
BUN: 19 mg/dL (ref 7–25)
CO2: 24 mmol/L (ref 20–32)
Calcium: 9.6 mg/dL (ref 8.6–10.3)
Chloride: 102 mmol/L (ref 98–110)
Creat: 0.94 mg/dL (ref 0.60–1.24)
Globulin: 2.3 g/dL (calc) (ref 1.9–3.7)
Glucose, Bld: 159 mg/dL — ABNORMAL HIGH (ref 65–139)
Potassium: 4.3 mmol/L (ref 3.5–5.3)
Sodium: 138 mmol/L (ref 135–146)
Total Bilirubin: 1.1 mg/dL (ref 0.2–1.2)
Total Protein: 7.1 g/dL (ref 6.1–8.1)
eGFR: 119 mL/min/{1.73_m2} (ref >=60)

## 2022-06-02 LAB — MICROALBUMIN / CREATININE URINE RATIO
Creatinine, Urine: 187 mg/dL (ref 20–320)
Microalb Creat Ratio: 6 mg/g creat (ref <30)
Microalb, Ur: 1.1 mg/dL

## 2022-06-02 LAB — T4, FREE: Free T4: 1.3 ng/dL (ref 0.8–1.4)

## 2022-06-02 LAB — TSH: TSH: 1.13 mIU/L (ref 0.40–4.50)

## 2022-06-21 ENCOUNTER — Encounter (HOSPITAL_COMMUNITY): Payer: Self-pay | Admitting: Psychiatry

## 2022-06-21 ENCOUNTER — Telehealth (HOSPITAL_BASED_OUTPATIENT_CLINIC_OR_DEPARTMENT_OTHER): Payer: No Typology Code available for payment source | Admitting: Psychiatry

## 2022-06-21 DIAGNOSIS — F411 Generalized anxiety disorder: Secondary | ICD-10-CM

## 2022-06-21 DIAGNOSIS — F331 Major depressive disorder, recurrent, moderate: Secondary | ICD-10-CM

## 2022-06-21 MED ORDER — FLUOXETINE HCL 10 MG PO CAPS: 10.0000 mg | ORAL_CAPSULE | Freq: Every day | ORAL | 1 refills | Status: DC

## 2022-06-21 MED ORDER — FLUOXETINE HCL 20 MG PO CAPS: 20.0000 mg | ORAL_CAPSULE | Freq: Every day | ORAL | 1 refills | Status: DC

## 2022-06-21 NOTE — Progress Notes (Signed)
BH MD/PA/NP OP Progress Note  06/21/2022 8:54 AM Ethan Montoya  MRN:  [id number]  Visit Diagnosis:    ICD-10-CM   1. GAD (generalized anxiety disorder)  F41.1 FLUoxetine (PROZAC) 20 MG capsule    2. Moderate episode of recurrent major depressive disorder (HCC)  F33.1 FLUoxetine (PROZAC) 20 MG capsule    FLUoxetine (PROZAC) 10 MG capsule      Assessment: Ethan Montoya is a 22 y.o. male with a history of diabtetes who presented to [facility] at [facility] for initial evaluation on 11/23/21.    On initial evaluation patient reported neurovegetative symptoms of depression including low mood, anhedonia, amotivation, fatigue, difficulty concentrating, and increased sleep.  He denied any SI/HI or thoughts of self-harm.  Patient did note that he has firearms that he keeps locked up, counseling was provided in relationship to this.  Patient denied any history of mania, auditory or visual hallucinations, paranoia, or delusions.  Of note patient does consume alcohol around 5 drinks a week and motivational interviewing techniques were used to encourage cessation.  Patient is at the precontemplation phase of abstinence.  Based on patient's presentation he met criteria for MDD and was started on Prozac.  Therapy was also discussed however patient declined at that time.  Ethan Montoya presents for follow-up evaluation. Today, 06/21/22, patient reports an initial improvement and the depression and anxiety symptoms after increasing Prozac to 30 mg and no adverse side effects at that time.  With the improvement patient had felt the medication was no longer necessary and discontinued after a couple months.  His mood gradually declined back to a more depressive state with anhedonia, amotivation, fatigue, and difficulty concentrating.  Patient had restarted Prozac 30 mg a couple weeks ago.  He denies adverse side effects other than some increased restlessness since restarting the medication.   Education was provided in regards to Prozac and its mechanism of action as well as medication compliance.  We will continue on this dose and follow-up in a month.  Plan: - Continue Prozac 30 mg daily for anxiety and depression - TSH, A1c, lipid profile reviewed - Crisis resources reviewed - Can consider therapy in the future - Follow up in a month   Chief Complaint:  Chief Complaint  Patient presents with   Follow-up   HPI: Ethan Montoya presents reporting that the past 6 months had initially been going fairly well after increasing the Prozac to 30 mg.  He notes that both his depression and anxiety symptoms had improved and he was getting along well day-to-day.  With the improvement patient thought he was doing better and gradually stopped taking the medications.  After discontinuing the Prozac patient had noticed that he began to fall back into a slump where he had no motivation, increased fatigue, difficulty concentrating, and feelings of worthlessness.  A couple weeks ago patient decided to restart taking his medications and has been taking the Prozac 30 mg for a few weeks now.  Since restarting the medication he has not noticed some mild improvement in the symptoms in addition to some increased restlessness/jitteriness especially at bedtime.  We provided education on the medications explaining that while these are long-acting medications he would need to take them daily to have the desired effect.  While he can improve on the medications after discontinuation that improvement is likely to fall off.  We also reviewed the mechanism of action of Prozac and how it can take 4 to 6 weeks for it to  show its full effect.  As he just restarted it a couple weeks ago patient has been unlikely to experience that benefit yet.  We also reviewed serotonin activation which can cause restlessness and jitteriness.  It was explained that typically when restarting on a medication we titrate up to mitigate some of the  serotonin activation and other potential adverse side effects.  Outside of the restlessness patient denies any adverse side effects from restarting Prozac at 30 mg.  As he has been on it for a few weeks at this point we would recommend continuing on this dose and allowing his body time to adapt to the increase in serotonin.  Past Psychiatric History: Had been in therapy on and off since he was 9. Never noticed much difference. Was more so for anger when he was younger and he is not sure what it was for when he was 18.  Patient notes that he tried Zoloft 18 after connecting with a virtual psychiatrist and noticed an improvement in his depression.  Eventually the medication started to have less effect potentially due to his missing doses as he started to feel better.  Dose was titrated up to 100 before patient discontinued the medication. Patient denies any prior psychiatric hospitalizations or suicide attempts.  Titrated Prozac to 30 mg QD on 12/22/21.  Patient had improved mood and anxiety on this dose however discontinued after a few months.  He restarted a around the beginning of May 2024  He reports alcohol use around 5 drinks a week that started at age 64.  He notes nicotine use.  He denies any other substance use.  Past Medical History:  Past Medical History:  Diagnosis Date   Asthma    Diabetes mellitus without complication (HCC)     Past Surgical History:  Procedure Laterality Date   ROOT CANAL     WISDOM TOOTH EXTRACTION      Family Psychiatric History: mom has depression, and father has alcohol use disorder.  His grandparents and uncles are all on some medications for mental health but he is unsure what they are.  Family History:  Family History  Problem Relation Age of Onset   Healthy Mother    Healthy Father     Social History:  Social History   Socioeconomic History   Marital status: Single    Spouse name: Not on file   Number of children: Not on file   Years of  education: Not on file   Highest education level: Not on file  Occupational History   Not on file  Tobacco Use   Smoking status: Some Days    Types: Cigarettes    Passive exposure: Yes   Smokeless tobacco: Never   Tobacco comments:    Pt smokes  Vaping Use   Vaping Use: Never used  Substance and Sexual Activity   Alcohol use: No   Drug use: Never   Sexual activity: Yes  Other Topics Concern   Not on file  Social History Narrative   Not on file   Social Determinants of Health   Financial Resource Strain: Not on file  Food Insecurity: Not on file  Transportation Needs: Not on file  Physical Activity: Not on file  Stress: Not on file  Social Connections: Not on file    Allergies: No Known Allergies  Current Medications: Current Outpatient Medications  Medication Sig Dispense Refill   Continuous Blood Gluc Receiver (DEXCOM G6 RECEIVER) DEVI 1 Device by Does not apply route daily  as needed. 1 Device 1   Continuous Blood Gluc Transmit (DEXCOM G6 TRANSMITTER) MISC CHANGE TRANSMITTER EVERY 90 DAYS 1 each 2   Continuous Glucose Sensor (DEXCOM G6 SENSOR) MISC Change sensor every 10 days. 9 each 1   FLUoxetine (PROZAC) 10 MG capsule Take 1 capsule (10 mg total) by mouth daily. Take with 20 mg tab for a total of 30 mg daily 30 capsule 1   FLUoxetine (PROZAC) 20 MG capsule Take 1 capsule (20 mg total) by mouth daily. Take with 10 mg tab for a total of 30 mg daily 30 capsule 1   Insulin Disposable Pump (OMNIPOD 5 G6 INTRO, GEN 5,) KIT Inject 1 Device into the skin as directed. Change pod every 2 days. This will be a 30 day supply. Please fill for [facility] [address]-40 1 kit 1   insulin glargine (LANTUS SOLOSTAR) 100 UNIT/ML Solostar Pen Inject up to 50 units per day as directed. Please keep on file for back up in case insulin pump malfunctions. (Patient not taking: Reported on 03/02/2022) 15 mL 3   insulin lispro (HUMALOG) 100 UNIT/ML injection Inject 300 units into pump every 48 hours 130  mL 6   ondansetron (ZOFRAN) 4 MG tablet Take 1 tablet (4 mg total) by mouth every 6 (six) hours. (Patient not taking: Reported on 03/02/2022) 12 tablet 0   No current facility-administered medications for this visit.     Psychiatric Specialty Exam: Review of Systems  There were no vitals taken for this visit.There is no height or weight on file to calculate BMI.  General Appearance: Fairly Groomed  Eye Contact:  Good  Speech:  Clear and Coherent and Normal Rate  Volume:  Normal  Mood:  Anxious and Depressed  Affect:  Congruent  Thought Process:  Coherent and Goal Directed  Orientation:  Full (Time, Place, and Person)  Thought Content: Logical   Suicidal Thoughts:  No  Homicidal Thoughts:  No  Memory:  NA  Judgement:  Fair  Insight:  Fair  Psychomotor Activity:  Normal  Concentration:  Concentration: Good  Recall:  Fair  Fund of Knowledge: Fair  Language: Good  Akathisia:  NA    AIMS (if indicated): not done  Assets:  Manufacturing systems engineer Housing Leisure Time Physical Health Talents/Skills Transportation  ADL's:  Intact  Cognition: WNL  Sleep:  Good   Metabolic Disorder Labs: Lab Results  Component Value Date   HGBA1C 7.1 06/01/2022   MPG 160 04/21/2021   MPG 183 07/21/2016   No results found for: "PROLACTIN" Lab Results  Component Value Date   CHOL 188 06/01/2022   TRIG 59 06/01/2022   HDL 41 06/01/2022   CHOLHDL 4.6 06/01/2022   LDLCALC 132 (H) 06/01/2022   LDLCALC 117 (H) 04/21/2021   Lab Results  Component Value Date   TSH 1.13 06/01/2022   TSH 0.82 04/21/2021    Therapeutic Level Labs: No results found for: "LITHIUM" No results found for: "VALPROATE" No results found for: "CBMZ"   Screenings: GAD-7    Flowsheet Row [facility]  Total GAD-7 Score 5      PHQ2-9    Flowsheet Row Office Visit from 11/23/2021 in [facility]  Video Visit from 04/05/2020 in [facility] at [facility]  PHQ-2 Total Score 3 2  PHQ-9 Total Score 7 12      Flowsheet Row ED from 01/25/2022 in [facility] at [facility] (  ) ED from 01/09/2022 in [facility]) Office Visit from 11[facility]23/2021 in [facility]  C-SSRS RISK CATEGORY No Risk No Risk No Risk       Collaboration of Care: Collaboration of Care: Medication Management AEB medication prescription and Other provider involved in patient's care AEB urgent care chart review  Patient/Guardian was advised Release of Information must be obtained prior to any record release in order to collaborate their care with an outside provider. Patient/Guardian was advised if they have not already done so to contact the registration department to sign all necessary forms in order for [place] to release information regarding their care.   Consent: Patient/Guardian gives verbal consent for treatment and assignment of benefits for services provided during this visit. Patient/Guardian expressed understanding and agreed to proceed.    Stasia Cavalier, MD 06/21/2022, 8:54 AM   Virtual Visit via Video Note  I connected with Valeda Malm on 06/21/22 at  8:30 AM EDT by a video enabled telemedicine application and verified that I am speaking with the correct person using two identifiers.  Location: Patient: Home Provider: Home Office   I discussed the limitations of evaluation and management by telemedicine and the availability of in person appointments. The patient expressed understanding and agreed to proceed.   I discussed the assessment and treatment plan with the patient. The patient was provided an opportunity to ask questions and all were answered. The patient agreed with the plan and demonstrated an understanding of the instructions.   The patient was advised to  call back or seek an in-person evaluation if the symptoms worsen or if the condition fails to improve as anticipated.  I provided 15 minutes of non-face-to-face time during this encounter.   Stasia Cavalier, MD

## 2022-07-11 ENCOUNTER — Other Ambulatory Visit (INDEPENDENT_AMBULATORY_CARE_PROVIDER_SITE_OTHER): Payer: Self-pay | Admitting: Pediatrics

## 2022-07-11 ENCOUNTER — Encounter (INDEPENDENT_AMBULATORY_CARE_PROVIDER_SITE_OTHER): Payer: Self-pay

## 2022-07-25 ENCOUNTER — Encounter (HOSPITAL_COMMUNITY): Payer: Self-pay

## 2022-07-25 ENCOUNTER — Encounter (HOSPITAL_COMMUNITY): Payer: No Typology Code available for payment source | Admitting: Psychiatry

## 2022-07-25 NOTE — Progress Notes (Signed)
This encounter was created in error - please disregard.

## 2022-07-25 NOTE — Progress Notes (Deleted)
BH MD/PA/NP OP Progress Note  07/25/2022 9:02 AM BLAYNE DUBA  MRN:  [id number]  Visit Diagnosis:  No diagnosis found.   Assessment: Ethan Montoya is a 22 y.o. male with a history of diabtetes who presented to [facility] at [facility] for initial evaluation on 11/23/21.    On initial evaluation patient reported neurovegetative symptoms of depression including low mood, anhedonia, amotivation, fatigue, difficulty concentrating, and increased sleep.  He denied any SI/HI or thoughts of self-harm.  Patient did note that he has firearms that he keeps locked up, counseling was provided in relationship to this.  Patient denied any history of mania, auditory or visual hallucinations, paranoia, or delusions.  Of note patient does consume alcohol around 5 drinks a week and motivational interviewing techniques were used to encourage cessation.  Patient is at the precontemplation phase of abstinence.  Based on patient's presentation he met criteria for MDD and was started on Prozac.  Therapy was also discussed however patient declined at that time.  Ethan Montoya presents for follow-up evaluation. Today, 07/25/22, patient reports     an initial improvement and the depression and anxiety symptoms after increasing Prozac to 30 mg and no adverse side effects at that time.  With the improvement patient had felt the medication was no longer necessary and discontinued after a couple months.  His mood gradually declined back to a more depressive state with anhedonia, amotivation, fatigue, and difficulty concentrating.  Patient had restarted Prozac 30 mg a couple weeks ago.  He denies adverse side effects other than some increased restlessness since restarting the medication.  Education was provided in regards to Prozac and its mechanism of action as well as medication compliance.  We will continue on this dose and follow-up in a month.  Plan: - Continue Prozac 30 mg daily for anxiety and  depression - TSH, A1c, lipid profile reviewed - Crisis resources reviewed - Can consider therapy in the future - Follow up in a month   Chief Complaint:  No chief complaint on file.  HPI: Ethan Montoya presents reporting that the past 6 months had initially been going fairly well after increasing the Prozac to 30 mg.  He notes that both his depression and anxiety symptoms had improved and he was getting along well day-to-day.  With the improvement patient thought he was doing better and gradually stopped taking the medications.  After discontinuing the Prozac patient had noticed that he began to fall back into a slump where he had no motivation, increased fatigue, difficulty concentrating, and feelings of worthlessness.  A couple weeks ago patient decided to restart taking his medications and has been taking the Prozac 30 mg for a few weeks now.  Since restarting the medication he has not noticed some mild improvement in the symptoms in addition to some increased restlessness/jitteriness especially at bedtime.  We provided education on the medications explaining that while these are long-acting medications he would need to take them daily to have the desired effect.  While he can improve on the medications after discontinuation that improvement is likely to fall off.  We also reviewed the mechanism of action of Prozac and how it can take 4 to 6 weeks for it to show its full effect.  As he just restarted it a couple weeks ago patient has been unlikely to experience that benefit yet.  We also reviewed serotonin activation which can cause restlessness and jitteriness.  It was explained that typically when restarting on a  medication we titrate up to mitigate some of the serotonin activation and other potential adverse side effects.  Outside of the restlessness patient denies any adverse side effects from restarting Prozac at 30 mg.  As he has been on it for a few weeks at this point we would recommend continuing on  this dose and allowing his body time to adapt to the increase in serotonin.  Past Psychiatric History: Had been in therapy on and off since he was 9. Never noticed much difference. Was more so for anger when he was younger and he is not sure what it was for when he was 18.  Patient notes that he tried Zoloft 18 after connecting with a virtual psychiatrist and noticed an improvement in his depression.  Eventually the medication started to have less effect potentially due to his missing doses as he started to feel better.  Dose was titrated up to 100 before patient discontinued the medication. Patient denies any prior psychiatric hospitalizations or suicide attempts.  Titrated Prozac to 30 mg QD on 12/22/21.  Patient had improved mood and anxiety on this dose however discontinued after a few months.  He restarted a around the beginning of May 2024  He reports alcohol use around 5 drinks a week that started at age 71.  He notes nicotine use.  He denies any other substance use.  Past Medical History:  Past Medical History:  Diagnosis Date   Asthma    Diabetes mellitus without complication (HCC)     Past Surgical History:  Procedure Laterality Date   ROOT CANAL     WISDOM TOOTH EXTRACTION      Family Psychiatric History: mom has depression, and father has alcohol use disorder.  His grandparents and uncles are all on some medications for mental health but he is unsure what they are.  Family History:  Family History  Problem Relation Age of Onset   Healthy Mother    Healthy Father     Social History:  Social History   Socioeconomic History   Marital status: Single    Spouse name: Not on file   Number of children: Not on file   Years of education: Not on file   Highest education level: Not on file  Occupational History   Not on file  Tobacco Use   Smoking status: Some Days    Types: Cigarettes    Passive exposure: Yes   Smokeless tobacco: Never   Tobacco comments:    Pt smokes   Vaping Use   Vaping Use: Never used  Substance and Sexual Activity   Alcohol use: No   Drug use: Never   Sexual activity: Yes  Other Topics Concern   Not on file  Social History Narrative   Not on file   Social Determinants of Health   Financial Resource Strain: Not on file  Food Insecurity: Not on file  Transportation Needs: Not on file  Physical Activity: Not on file  Stress: Not on file  Social Connections: Not on file    Allergies: No Known Allergies  Current Medications: Current Outpatient Medications  Medication Sig Dispense Refill   Continuous Blood Gluc Receiver (DEXCOM G6 RECEIVER) DEVI 1 Device by Does not apply route daily as needed. 1 Device 1   Continuous Blood Gluc Transmit (DEXCOM G6 TRANSMITTER) MISC CHANGE TRANSMITTER EVERY 90 DAYS 1 each 2   Continuous Glucose Sensor (DEXCOM G6 SENSOR) MISC Change sensor every 10 days. 9 each 1   FLUoxetine (PROZAC) 10  MG capsule Take 1 capsule (10 mg total) by mouth daily. Take with 20 mg tab for a total of 30 mg daily 30 capsule 1   FLUoxetine (PROZAC) 20 MG capsule Take 1 capsule (20 mg total) by mouth daily. Take with 10 mg tab for a total of 30 mg daily 30 capsule 1   Insulin Disposable Pump (OMNIPOD 5 G6 INTRO, GEN 5,) KIT Inject 1 Device into the skin as directed. Change pod every 2 days. This will be a 30 day supply. Please fill for [facility] [address]-40 1 kit 1   Insulin Disposable Pump (OMNIPOD 5 G6 PODS, GEN 5,) MISC INJECT 1 DEVICE INTO THE SKIN AS DIRECTED. CHANGE POD EVERY 2 DAYS. 15 each 4   insulin glargine (LANTUS SOLOSTAR) 100 UNIT/ML Solostar Pen Inject up to 50 units per day as directed. Please keep on file for back up in case insulin pump malfunctions. (Patient not taking: Reported on 03/02/2022) 15 mL 3   insulin lispro (HUMALOG) 100 UNIT/ML injection Inject 300 units into pump every 48 hours 130 mL 6   ondansetron (ZOFRAN) 4 MG tablet Take 1 tablet (4 mg total) by mouth every 6 (six) hours. (Patient not taking:  Reported on 03/02/2022) 12 tablet 0   No current facility-administered medications for this visit.     Psychiatric Specialty Exam: Review of Systems  There were no vitals taken for this visit.There is no height or weight on file to calculate BMI.  General Appearance: Fairly Groomed  Eye Contact:  Good  Speech:  Clear and Coherent and Normal Rate  Volume:  Normal  Mood:  Anxious and Depressed  Affect:  Congruent  Thought Process:  Coherent and Goal Directed  Orientation:  Full (Time, Place, and Person)  Thought Content: Logical   Suicidal Thoughts:  No  Homicidal Thoughts:  No  Memory:  NA  Judgement:  Fair  Insight:  Fair  Psychomotor Activity:  Normal  Concentration:  Concentration: Good  Recall:  Fair  Fund of Knowledge: Fair  Language: Good  Akathisia:  NA    AIMS (if indicated): not done  Assets:  Manufacturing systems engineer Housing Leisure Time Physical Health Talents/Skills Transportation  ADL's:  Intact  Cognition: WNL  Sleep:  Good   Metabolic Disorder Labs: Lab Results  Component Value Date   HGBA1C 7.1 06/01/2022   MPG 160 04/21/2021   MPG 183 07/21/2016   No results found for: "PROLACTIN" Lab Results  Component Value Date   CHOL 188 06/01/2022   TRIG 59 06/01/2022   HDL 41 06/01/2022   CHOLHDL 4.6 06/01/2022   LDLCALC 132 (H) 06/01/2022   LDLCALC 117 (H) 04/21/2021   Lab Results  Component Value Date   TSH 1.13 06/01/2022   TSH 0.82 04/21/2021    Therapeutic Level Labs: No results found for: "LITHIUM" No results found for: "VALPROATE" No results found for: "CBMZ"   Screenings: GAD-7    Flowsheet Row [facility]23/20[facility]  Total GAD-7 Score 5      PHQ2-9    Flowsheet Row Office Visit from 11/23/2021 in [facility] Video Visit from 04/05/2020 in [facility]  PHQ-2 Total Score 3 2   PHQ-9 Total Score 7 12      Flowsheet Row ED from 01/25/2022 in [facility] at [facility]) ED from 01/09/2022 in [facility] at [facility]) Office Visit from 11/23/2021  in [facility]  C-SSRS RISK CATEGORY No Risk No Risk No Risk       Collaboration of Care: Collaboration of Care: Medication Management AEB medication prescription  Patient/Guardian was advised Release of Information must be obtained prior to any record release in order to collaborate their care with an outside provider. Patient/Guardian was advised if they have not already done so to contact the registration department to sign all necessary forms in order for [place] to release information regarding their care.   Consent: Patient/Guardian gives verbal consent for treatment and assignment of benefits for services provided during this visit. Patient/Guardian expressed understanding and agreed to proceed.    Stasia Cavalier, MD 07/25/2022, 9:02 AM   Virtual Visit via Video Note  I connected with Ethan Montoya on 07/25/22 at 11:00 AM EDT by a video enabled telemedicine application and verified that I am speaking with the correct person using two identifiers.  Location: Patient: Home Provider: Home Office   I discussed the limitations of evaluation and management by telemedicine and the availability of in person appointments. The patient expressed understanding and agreed to proceed.   I discussed the assessment and treatment plan with the patient. The patient was provided an opportunity to ask questions and all were answered. The patient agreed with the plan and demonstrated an understanding of the instructions.   The patient was advised to call back or seek an in-person evaluation if the symptoms worsen or if the condition fails to improve as anticipated.  I provided 15 minutes of non-face-to-face time during this  encounter.   Stasia Cavalier, MD

## 2022-08-07 ENCOUNTER — Encounter (INDEPENDENT_AMBULATORY_CARE_PROVIDER_SITE_OTHER): Payer: Self-pay | Admitting: Family

## 2022-08-07 ENCOUNTER — Encounter (INDEPENDENT_AMBULATORY_CARE_PROVIDER_SITE_OTHER): Payer: Self-pay

## 2022-08-19 ENCOUNTER — Other Ambulatory Visit (INDEPENDENT_AMBULATORY_CARE_PROVIDER_SITE_OTHER): Payer: Self-pay | Admitting: Family

## 2022-08-24 ENCOUNTER — Encounter (INDEPENDENT_AMBULATORY_CARE_PROVIDER_SITE_OTHER): Payer: Self-pay

## 2022-08-31 ENCOUNTER — Ambulatory Visit (INDEPENDENT_AMBULATORY_CARE_PROVIDER_SITE_OTHER): Payer: No Typology Code available for payment source | Admitting: Family

## 2022-08-31 NOTE — Progress Notes (Deleted)
Pediatric Endocrinology Diabetes Consultation Follow-up Visit  Ethan Montoya 05-Nov-2000 [id number]  Chief Complaint: Follow-up type 1 diabetes   Velvet Bathe, MD   HPI: Ethan Montoya  is a 22 y.o. male presenting for follow-up of type 1 diabetes. he is accompanied to this visit by his Mother .  1. Ethan Montoya presented to the ED on 07/20/16 with 2 days of increased polyuria/polydipsia and acute weight loss. He was on his second steroid burst for apparent contact dermatitis secondary to poison ivy. His mother has type 1 diabetes and recognized symptoms of diabetes. BG was 1,043 mg/dL without acidosis. There was >500 glucose in the urine but no ketones. He was admitted to the PICU for fluid resuscitation prior to initiation of insulin per hyperglycemia, non ketotic protocol.  In the PICU he had an observed seizure episode. He had a stat CT which was negative. They then thought perhaps seizure was a vagal response. He was transferred to the ward the next day. BG decreased with hydration to 400 and he was started on two component insulin with Lantus and Novolog at that time. 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   2. Since last visit to [facility] on 05/2022 , he has been well.  No ER visits or hospitalizations.  He stopped Prozac for about 1 month and reports he struggled with depression and anxiety while being off of it which affected his diabetes. He restarted about 1 week ago. Using Omnipod insulin pump and Dexcom G6. Rarely has pod failures. He has not been bolusing often. Blood sugars have been running higher. Hypoglycemia has been rare, he is able to feel symptoms when low.    Insulin regimen:  Omnipod 5   If using injections --> 10 units of Lantus. Novolog 150/50/15 plan  Basal (Max: 0.6 units/hr) 12AM 0.45                         Total: 10.8 units per day    Insulin to carbohydrate ratio (ICR)  12AM 25  9AM 17   11PM 20   5PM 12   9PM 12        Max Bolus: 14 units   Insulin  Sensitivity Factor (ISF) 12AM 55                              Target BG & Correct Above BG 12AM 110                            Hypoglycemia: Able to feel low blood sugars.  No glucagon needed recently.  Blood glucose download: Did not bring meter  Omnipod Insulin pump downlaod    Med-alert ID: Not currently wearing. Injection sites: legs and arms  Annual labs due: 04/2022--> ordered  Ophthalmology due: Due. Discussed today     3. ROS: Greater than 10 systems reviewed with pertinent positives listed in HPI, otherwise neg. Constitutional: Sleeping well. 5 lbs weight loss.  Eyes: No changes in vision. No blurry vision  Ears/Nose/Mouth/Throat: No difficulty swallowing. Cardiovascular: No palpitations. No chest pain  Respiratory: No increased work of breathing. No SOB  Gastrointestinal: No constipation or diarrhea. No abdominal pain Genitourinary: No nocturia, no polyuria Neurologic: Normal sensation, no tremor Endocrine: No polydipsia.  No hyperpigmentation Psychiatric: Normal affect  Past Medical History:   Past Medical History:  Diagnosis Date   Asthma    Diabetes  mellitus without complication (HCC)     Medications:  Outpatient Encounter Medications as of 08/31/2022  Medication Sig   Continuous Blood Gluc Receiver (DEXCOM G6 RECEIVER) DEVI 1 Device by Does not apply route daily as needed.   Continuous Blood Gluc Transmit (DEXCOM G6 TRANSMITTER) MISC CHANGE TRANSMITTER EVERY 90 DAYS   Continuous Glucose Sensor (DEXCOM G6 SENSOR) MISC Change sensor every 10 days.   FLUoxetine (PROZAC) 10 MG capsule Take 1 capsule (10 mg total) by mouth daily. Take with 20 mg tab for a total of 30 mg daily   FLUoxetine (PROZAC) 20 MG capsule Take 1 capsule (20 mg total) by mouth daily. Take with 10 mg tab for a total of 30 mg daily   Insulin Disposable Pump (OMNIPOD 5 G6 INTRO, GEN 5,) KIT Inject 1 Device into the skin as directed. Change pod every 2 days. This will be a 30 day supply.  Please fill for [facility] [id number]   Insulin Disposable Pump (OMNIPOD 5 G6 PODS, GEN 5,) MISC INJECT 1 DEVICE INTO THE SKIN AS DIRECTED. CHANGE POD EVERY 2 DAYS.   insulin glargine (LANTUS SOLOSTAR) 100 UNIT/ML Solostar Pen Inject up to 50 units per day as directed. Please keep on file for back up in case insulin pump malfunctions. (Patient not taking: Reported on 03/02/2022)   insulin lispro (HUMALOG) 100 UNIT/ML injection INJECT 300 UNITS INTO PUMP EVERY 48 HOURS   ondansetron (ZOFRAN) 4 MG tablet Take 1 tablet (4 mg total) by mouth every 6 (six) hours. (Patient not taking: Reported on 03/02/2022)   [DISCONTINUED] cetirizine (ZYRTEC ALLERGY) 10 MG tablet Take 1 tablet (10 mg total) by mouth daily. (Patient not taking: No sig reported)   [DISCONTINUED] ipratropium (ATROVENT) 0.06 % nasal spray Place 2 sprays into both nostrils 4 (four) times daily. (Patient not taking: No sig reported)   [DISCONTINUED] NOVOPEN ECHO DEVI Use to inject insulin 4-5x day (Patient not taking: No sig reported)   No facility-administered encounter medications on file as of 08/31/2022.    Allergies: No Known Allergies  Surgical History: Past Surgical History:  Procedure Laterality Date   ROOT CANAL     WISDOM TOOTH EXTRACTION      Family History:  Mother has Type 1 diabetes diagnosed at age 40.    Social History: Lives with: Parents and sister   Graduated. Works as a Actor    Physical Exam:  There were no vitals filed for this visit.     There were no vitals taken for this visit. Body mass index: body mass index is unknown because there is no height or weight on file. Growth %ile SmartLinks can only be used for patients less than 74 years old.  Ht Readings from Last 3 Encounters:  04/08/20 5' 11.26" (1.81 m) (75%, Z= 0.67)*  01/08/20 5' 11.1" (1.806 m) (74%, Z= 0.63)*  10/06/19 5' 10.87" (1.8 m) (72%, Z= 0.57)*   * Growth percentiles are based on CDC (Boys, 2-20 Years) data.   Wt Readings from  Last 3 Encounters:  06/01/22 142 lb 12.8 oz (64.8 kg)  03/02/22 147 lb (66.7 kg)  11/02/21 152 lb 6.4 oz (69.1 kg) (46%, Z= -0.10)*   * Growth percentiles are based on CDC (Boys, 2-20 Years) data.   Physical Exam  General: Well developed, well nourished male in no acute distress.  Head: Normocephalic, atraumatic.   Eyes:  Pupils equal and round. EOMI.  Sclera white.  No eye drainage.   Ears/Nose/Mouth/Throat: Nares patent, no nasal drainage.  Normal dentition, mucous membranes moist.  Neck: supple, no cervical lymphadenopathy, no thyromegaly Cardiovascular: regular rate, normal S1/S2, no murmurs Respiratory: No increased work of breathing.  Lungs clear to auscultation bilaterally.  No wheezes. Abdomen: soft, nontender, nondistended. Normal bowel sounds.  No appreciable masses  Extremities: warm, well perfused, cap refill < 2 sec.   Musculoskeletal: Normal muscle mass.  Normal strength Skin: warm, dry.  No rash or lesions. Neurologic: alert and oriented, normal speech, no tremor    Labs:  Results for orders placed or performed in visit on 06/01/22  COMPLETE METABOLIC PANEL WITH GFR  Result Value Ref Range   Glucose, Bld 159 (H) 65 - 139 mg/dL   BUN 19 7 - 25 mg/dL   Creat 8.65 7.84 - 6.96 mg/dL   eGFR 295 > OR = 60 MW/UXL/2.44W1   BUN/Creatinine Ratio SEE NOTE: 6 - 22 (calc)   Sodium 138 135 - 146 mmol/L   Potassium 4.3 3.5 - 5.3 mmol/L   Chloride 102 98 - 110 mmol/L   CO2 24 20 - 32 mmol/L   Calcium 9.6 8.6 - 10.3 mg/dL   Total Protein 7.1 6.1 - 8.1 g/dL   Albumin 4.8 3.6 - 5.1 g/dL   Globulin 2.3 1.9 - 3.7 g/dL (calc)   AG Ratio 2.1 1.0 - 2.5 (calc)   Total Bilirubin 1.1 0.2 - 1.2 mg/dL   Alkaline phosphatase (APISO) 79 36 - 130 U/L   AST 12 10 - 40 U/L   ALT 12 9 - 46 U/L  Lipid panel  Result Value Ref Range   Cholesterol 188 <200 mg/dL   HDL 41 > OR = 40 mg/dL   Triglycerides 59 <027 mg/dL   LDL Cholesterol (Calc) 132 (H) mg/dL (calc)   Total CHOL/HDL Ratio 4.6  <5.0 (calc)   Non-HDL Cholesterol (Calc) 147 (H) <130 mg/dL (calc)  Microalbumin / creatinine urine ratio  Result Value Ref Range   Creatinine, Urine 187 20 - 320 mg/dL   Microalb, Ur 1.1 mg/dL   Microalb Creat Ratio 6 <30 mg/g creat  T4, free  Result Value Ref Range   Free T4 1.3 0.8 - 1.4 ng/dL  TSH  Result Value Ref Range   TSH 1.13 0.40 - 4.50 mIU/L  POCT glycosylated hemoglobin (Hb A1C)  Result Value Ref Range   Hemoglobin A1C     HbA1c POC (<> result, manual entry) 7.1 4.0 - 5.6 %   HbA1c, POC (prediabetic range)     HbA1c, POC (controlled diabetic range)    POCT Glucose (Device for Home Use)  Result Value Ref Range   Glucose Fasting, POC 111 (A) 70 - 99 mg/dL   POC Glucose       Assessment/Plan: Tiron is a 22 y.o. male with type 1 diabetes recently started on Omnipod 5 insulin pump and Dexcom CGM. He has not been bolusing consistently which occasionally leads to hyperglycemia. However, his hemoglobin A1c is 7.1% today and time in target range is 56%.   1. Type 1 Diabetes  (HCC) 2. Hyperglycemia - Reviewed insulin pump and CGM download. Discussed trends and patterns.  - Rotate pump sites to prevent scar tissue.  - bolus 15 minutes prior to eating to limit blood sugar spikes.  - Reviewed carb counting and importance of accurate carb counting.  - Discussed signs and symptoms of hypoglycemia. Always have glucose available.  - POCT glucose and hemoglobin A1c  - Reviewed growth chart.    3. Insulin pump titration  No changes  today.   Follow-up: 3 months.   LOS: >40  spent today reviewing the medical chart, counseling the patient/family, and documenting today's visit.   When a patient is on insulin, intensive monitoring of blood glucose levels is necessary to avoid hyperglycemia and hypoglycemia. Severe hyperglycemia/hypoglycemia can lead to hospital admissions and be life threatening.     Gretchen Short,  FNP-C  Pediatric Specialist  [address]   [place], [address]  Tele: [phone]

## 2022-09-05 ENCOUNTER — Telehealth (HOSPITAL_COMMUNITY): Payer: Self-pay | Admitting: Psychiatry

## 2022-09-05 NOTE — Telephone Encounter (Signed)
Patient called in to schedule a follow up with Dr Mercy Riding and request a refill.  FLUoxetine (PROZAC) 10 MG capsule  FLUoxetine (PROZAC) 20 MG capsule  Pharmacy:   [id number] - [place], [place] - [address] (Ph: [phone])

## 2022-09-07 ENCOUNTER — Other Ambulatory Visit (HOSPITAL_COMMUNITY): Payer: Self-pay

## 2022-09-07 DIAGNOSIS — F331 Major depressive disorder, recurrent, moderate: Secondary | ICD-10-CM

## 2022-09-07 DIAGNOSIS — F411 Generalized anxiety disorder: Secondary | ICD-10-CM

## 2022-09-07 MED ORDER — FLUOXETINE HCL 20 MG PO CAPS: 20.0000 mg | ORAL_CAPSULE | Freq: Every day | ORAL | 0 refills | Status: DC

## 2022-09-07 MED ORDER — FLUOXETINE HCL 10 MG PO CAPS: 10.0000 mg | ORAL_CAPSULE | Freq: Every day | ORAL | 0 refills | Status: DC

## 2022-09-10 ENCOUNTER — Encounter (INDEPENDENT_AMBULATORY_CARE_PROVIDER_SITE_OTHER): Payer: Self-pay

## 2022-10-23 NOTE — Progress Notes (Unsigned)
BH MD/PA/NP OP Progress Note  10/24/2022 4:19 PM Ethan Montoya  MRN:  [id number]  Visit Diagnosis:    ICD-10-CM   1. GAD (generalized anxiety disorder)  F41.1 FLUoxetine (PROZAC) 40 MG capsule    2. Moderate episode of recurrent major depressive disorder (HCC)  F33.1 FLUoxetine (PROZAC) 40 MG capsule       Assessment: Ethan Montoya is a 22 y.o. male with a history of diabtetes who presented to [facility] for initial evaluation on 11/23/21.    On initial evaluation patient reported neurovegetative symptoms of depression including low mood, anhedonia, amotivation, fatigue, difficulty concentrating, and increased sleep.  He denied any SI/HI or thoughts of self-harm.  Patient did note that he has firearms that he keeps locked up, counseling was provided in relationship to this.  Patient denied any history of mania, auditory or visual hallucinations, paranoia, or delusions.  Of note patient does consume alcohol around 5 drinks a week and motivational interviewing techniques were used to encourage cessation.  Patient is at the precontemplation phase of abstinence.  Based on patient's presentation he met criteria for MDD and was started on Prozac.  Therapy was also discussed however patient declined at that time.  Ethan Montoya presents for follow-up evaluation. Today, 10/24/22, patient reports some intermittent mood lability over the past 4 months though overall things have been stable.  There was a period of increased anxiety month ago though this is secondary to psychosocial stressors of starting a new job.  Patient is taking Prozac consistently denies any adverse side effects.  We will increase Prozac to 40 mg a day and reviewed the risk and benefits.  Also discussed as needed medications however patient declined at this time.  Will follow up in 2 months  Plan: - Increase Prozac to 40 mg daily for anxiety and depression - TSH, A1c, lipid profile  reviewed - Crisis resources reviewed - Can consider therapy in the future - Follow up in 2 months   Chief Complaint:  Chief Complaint  Patient presents with   Follow-up   HPI: Ethan Montoya presents reporting that the past 4 months have gone alright.  He started a new job around 2 months ago.  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  The changes made as he makes more money in this field though it has been a bit stressful.  He is working around 60 hours a week and then attends school in the evening some days.  Patient works 6 days a week with Sundays being his day off.  With the busy schedule he finds himself sleeping a lot more due to exhaustion.  There was also a period around a month ago where he had increased anxiety with restlessness and racing thoughts however he believes this was related to stress with a new job.  Ethan Montoya notes that he has been sleeping well and his appetite has been good.  He has been taking his medication fairly consistently and does think it helps him feel better especially compared to how he has been in the past.  She also mentioned that is interested in further titration of the medication.  We also did discuss as needed medications however and felt that these were not needed at this time.  We will increase Prozac to 40 mg reviewed the risk and benefits.  Past Psychiatric History: Had been in therapy on and off since he was 9. Never noticed much difference. Was more so for  anger when he was younger and he is not sure what it was for when he was 18.  Patient notes that he tried Zoloft 18 after connecting with a virtual psychiatrist and noticed an improvement in his depression.  Eventually the medication started to have less effect potentially due to his missing doses as he started to feel better.  Dose was titrated up to 100 before patient discontinued the medication. Patient denies any prior psychiatric hospitalizations or suicide attempts.  Titrated Prozac to 30  mg QD on 12/22/21.  Patient had improved mood and anxiety on this dose however discontinued after a few months.  He restarted a around the beginning of May 2024  He reports alcohol use around 5 drinks a week that started at age 55.  He notes nicotine use.  He denies any other substance use.  Past Medical History:  Past Medical History:  Diagnosis Date   Asthma    Diabetes mellitus without complication (HCC)     Past Surgical History:  Procedure Laterality Date   ROOT CANAL     WISDOM TOOTH EXTRACTION      Family Psychiatric History: mom has depression, and father has alcohol use disorder.  His grandparents and uncles are all on some medications for mental health but he is unsure what they are.  Family History:  Family History  Problem Relation Age of Onset   Healthy Mother    Healthy Father     Social History:  Social History   Socioeconomic History   Marital status: Single    Spouse name: Not on file   Number of children: Not on file   Years of education: Not on file   Highest education level: Not on file  Occupational History   Not on file  Tobacco Use   Smoking status: Some Days    Types: Cigarettes    Passive exposure: Yes   Smokeless tobacco: Never   Tobacco comments:    Pt smokes  Vaping Use   Vaping status: Never Used  Substance and Sexual Activity   Alcohol use: No   Drug use: Never   Sexual activity: Yes  Other Topics Concern   Not on file  Social History Narrative   Not on file   Social Determinants of Health   Financial Resource Strain: Not on file  Food Insecurity: Not on file  Transportation Needs: Not on file  Physical Activity: Not on file  Stress: Not on file  Social Connections: Not on file    Allergies: No Known Allergies  Current Medications: Current Outpatient Medications  Medication Sig Dispense Refill   Continuous Blood Gluc Receiver (DEXCOM G6 RECEIVER) DEVI 1 Device by Does not apply route daily as needed. 1 Device 1    Continuous Blood Gluc Transmit (DEXCOM G6 TRANSMITTER) MISC CHANGE TRANSMITTER EVERY 90 DAYS 1 each 2   Continuous Glucose Sensor (DEXCOM G6 SENSOR) MISC Change sensor every 10 days. 9 each 1   FLUoxetine (PROZAC) 10 MG capsule Take 1 capsule (10 mg total) by mouth daily. Take with 20 mg tab for a total of 30 mg daily 30 capsule 0   FLUoxetine (PROZAC) 40 MG capsule Take 1 capsule (40 mg total) by mouth daily. 30 capsule 2   Insulin Disposable Pump (OMNIPOD 5 G6 INTRO, GEN 5,) KIT Inject 1 Device into the skin as directed. Change pod every 2 days. This will be a 30 day supply. Please fill for [facility] [id number] 1 kit 1   Insulin Disposable Pump (  OMNIPOD 5 G6 PODS, GEN 5,) MISC INJECT 1 DEVICE INTO THE SKIN AS DIRECTED. CHANGE POD EVERY 2 DAYS. 15 each 4   insulin glargine (LANTUS SOLOSTAR) 100 UNIT/ML Solostar Pen Inject up to 50 units per day as directed. Please keep on file for back up in case insulin pump malfunctions. (Patient not taking: Reported on 03/02/2022) 15 mL 3   insulin lispro (HUMALOG) 100 UNIT/ML injection INJECT 300 UNITS INTO PUMP EVERY 48 HOURS 130 mL 6   ondansetron (ZOFRAN) 4 MG tablet Take 1 tablet (4 mg total) by mouth every 6 (six) hours. (Patient not taking: Reported on 03/02/2022) 12 tablet 0   No current facility-administered medications for this visit.     Psychiatric Specialty Exam: Review of Systems  There were no vitals taken for this visit.There is no height or weight on file to calculate BMI.  General Appearance: Fairly Groomed  Eye Contact:  Good  Speech:  Clear and Coherent and Normal Rate  Volume:  Normal  Mood:  Anxious  Affect:  Congruent  Thought Process:  Coherent and Goal Directed  Orientation:  Full (Time, Place, and Person)  Thought Content: Logical   Suicidal Thoughts:  No  Homicidal Thoughts:  No  Memory:  NA  Judgement:  Fair  Insight:  Fair  Psychomotor Activity:  Normal  Concentration:  Concentration: Good  Recall:  Fair  Fund of  Knowledge: Fair  Language: Good  Akathisia:  NA    AIMS (if indicated): not done  Assets:  Manufacturing systems engineer Housing Leisure Time Physical Health Talents/Skills Transportation  ADL's:  Intact  Cognition: WNL  Sleep:  Good   Metabolic Disorder Labs: Lab Results  Component Value Date   HGBA1C 7.5 (A) 10/24/2022   MPG 160 04/21/2021   MPG 183 07/21/2016   No results found for: "PROLACTIN" Lab Results  Component Value Date   CHOL 188 06/01/2022   TRIG 59 06/01/2022   HDL 41 06/01/2022   CHOLHDL 4.6 06/01/2022   LDLCALC 132 (H) 06/01/2022   LDLCALC 117 (H) 04/21/2021   Lab Results  Component Value Date   TSH 1.13 06/01/2022   TSH 0.82 04/21/2021    Therapeutic Level Labs: No results found for: "LITHIUM" No results found for: "VALPROATE" No results found for: "CBMZ"   Screenings: GAD-7    Flowsheet Row [facility]  Total GAD-7 Score 5      PHQ2-9    Flowsheet Row Office Visit [facility] 11/23/2021 in [facility] Video Visit from 04/05/2020 in [facility]  PHQ-2 Total Score 3 2  PHQ-9 Total Score 7 12      Flowsheet Row ED from 01/25/2022 in [facility] at [facility] ED from 01/09/2022 in [facility] Office Visit from 11/23/2021 in [facility]  C-SSRS RISK CATEGORY No Risk No Risk No Risk       Collaboration of Care: Collaboration of Care: Medication Management AEB medication prescription and Other provider involved in patient's care AEB urgent care chart review  Patient/Guardian was advised Release of Information must be obtained prior to any record release in order to collaborate their care with an outside provider. Patient/Guardian was advised if they have not already done so to  contact the registration department to sign all necessary forms in order for [place] to release information regarding their care.   Consent:  Patient/Guardian gives verbal consent for treatment and assignment of benefits for services provided during this visit. Patient/Guardian expressed understanding and agreed to proceed.    Stasia Cavalier, MD 10/24/2022, 4:19 PM   Virtual Visit via Video Note  I connected with Valeda Malm on 10/24/22 at  4:00 PM EDT by a video enabled telemedicine application and verified that I am speaking with the correct person using two identifiers.  Location: Patient: Home Provider: Home Office   I discussed the limitations of evaluation and management by telemedicine and the availability of in person appointments. The patient expressed understanding and agreed to proceed.   I discussed the assessment and treatment plan with the patient. The patient was provided an opportunity to ask questions and all were answered. The patient agreed with the plan and demonstrated an understanding of the instructions.   The patient was advised to call back or seek an in-person evaluation if the symptoms worsen or if the condition fails to improve as anticipated.  I provided 15 minutes of non-face-to-face time during this encounter.   Stasia Cavalier, MD

## 2022-10-24 ENCOUNTER — Encounter (INDEPENDENT_AMBULATORY_CARE_PROVIDER_SITE_OTHER): Payer: Self-pay | Admitting: Family

## 2022-10-24 ENCOUNTER — Telehealth (HOSPITAL_BASED_OUTPATIENT_CLINIC_OR_DEPARTMENT_OTHER): Payer: No Typology Code available for payment source | Admitting: Psychiatry

## 2022-10-24 ENCOUNTER — Ambulatory Visit (INDEPENDENT_AMBULATORY_CARE_PROVIDER_SITE_OTHER): Payer: No Typology Code available for payment source | Admitting: Family

## 2022-10-24 ENCOUNTER — Encounter: Payer: Self-pay | Admitting: Family

## 2022-10-24 VITALS — BP 122/72 | HR 74 | Wt 141.6 lb

## 2022-10-24 DIAGNOSIS — F331 Major depressive disorder, recurrent, moderate: Secondary | ICD-10-CM

## 2022-10-24 DIAGNOSIS — E1065 Type 1 diabetes mellitus with hyperglycemia: Secondary | ICD-10-CM | POA: Diagnosis not present

## 2022-10-24 DIAGNOSIS — Z9641 Presence of insulin pump (external) (internal): Secondary | ICD-10-CM | POA: Diagnosis not present

## 2022-10-24 DIAGNOSIS — F411 Generalized anxiety disorder: Secondary | ICD-10-CM | POA: Diagnosis not present

## 2022-10-24 LAB — POCT GLYCOSYLATED HEMOGLOBIN (HGB A1C): Hemoglobin A1C: 7.5 % — AB (ref 4.0–5.6)

## 2022-10-24 LAB — POCT GLUCOSE (DEVICE FOR HOME USE): Glucose Fasting, POC: 160 mg/dL — AB (ref 70–99)

## 2022-10-24 MED ORDER — FLUOXETINE HCL 40 MG PO CAPS: 40.0000 mg | ORAL_CAPSULE | Freq: Every day | ORAL | 2 refills | Status: DC

## 2022-10-24 NOTE — Patient Instructions (Signed)
It was a pleasure seeing you in clinic today. Please do not hesitate to contact me if you have questions or concerns.   Please sign up for MyChart. This is a communication tool that allows you to send an email directly to me. This can be used for questions, prescriptions and blood sugar reports. We will also release labs to you with instructions on MyChart. Please do not use MyChart if you need immediate or emergency assistance. Ask our wonderful front office staff if you need assistance.   - A1c is 7.5%  - Work on bolusing consistently for carb intake, especially at night.

## 2022-10-24 NOTE — Progress Notes (Signed)
Pediatric Endocrinology Diabetes Consultation Follow-up Visit  Ethan Montoya 05/06/2000 [id number]  Chief Complaint: Follow-up type 1 diabetes   Velvet Bathe, MD   HPI: Ethan Montoya  is a 22 y.o. male presenting for follow-up of type 1 diabetes. he is accompanied to this visit by his Mother .  1. Jaydee presented to the ED on 07/20/16 with 2 days of increased polyuria/polydipsia and acute weight loss. He was on his second steroid burst for apparent contact dermatitis secondary to poison ivy. His mother has type 1 diabetes and recognized symptoms of diabetes. BG was 1,043 mg/dL without acidosis. There was >500 glucose in the urine but no ketones. He was admitted to the PICU for fluid resuscitation prior to initiation of insulin per hyperglycemia, non ketotic protocol.  In the PICU he had an observed seizure episode. He had a stat CT which was negative. They then thought perhaps seizure was a vagal response. He was transferred to the ward the next day. BG decreased with hydration to 400 and he was started on two component insulin with Lantus and Novolog at that time. 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   2. Since last visit to [facility] on 05/2022 , he has been well.  No ER visits or hospitalizations.  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 He is 30 mg of Prozac once per day, he feels like it has been helpful with depression and anxiety.   Wearing Omnipod 5 insulin pump and Dexcom G7, he has not had many failed pump sites other then [place] was [place] the [place]. He reports doing better with bolusing but needs to improve consistency. When he is busy at work he will decrease his bolus by about 10 grams of carbs. He is able to feel symptoms when his blood sugar is under 80. No severe hypoglycemia.    Insulin regimen:  Omnipod 5   If using injections --> 10 units of Lantus. Novolog 150/50/15 plan  Basal (Max: 0.6 units/hr) 12AM 0.45                         Total: 10.8 units per  day    Insulin to carbohydrate ratio (ICR)  12AM 25  9AM 17   11PM 20   5PM 12   9PM 12        Max Bolus: 14 units   Insulin Sensitivity Factor (ISF) 12AM 55                              Target BG & Correct Above BG 12AM 110                            Hypoglycemia: Able to feel low blood sugars.  No glucagon needed recently.  Blood glucose download: Did not bring meter  Omnipod Insulin pump downlaod    Med-alert ID: Not currently wearing. Injection sites: legs and arms  Annual labs due: 06/2022 Ophthalmology due: Discussed importance of annual eye exam today.     3. ROS: Greater than 10 systems reviewed with pertinent positives listed in HPI, otherwise neg. Constitutional: Sleeping well. Weight is stable.  Eyes: No changes in vision. No blurry vision  Ears/Nose/Mouth/Throat: No difficulty swallowing. Cardiovascular: No palpitations. No chest pain  Respiratory: No increased work of breathing. No SOB  Gastrointestinal: No constipation or diarrhea. No abdominal pain Genitourinary:  No nocturia, no polyuria Neurologic: Normal sensation, no tremor Endocrine: No polydipsia.  No hyperpigmentation Psychiatric: Normal affect  Past Medical History:   Past Medical History:  Diagnosis Date   Asthma    Diabetes mellitus without complication (HCC)     Medications:  Outpatient Encounter Medications as of 10/24/2022  Medication Sig   Continuous Blood Gluc Receiver (DEXCOM G6 RECEIVER) DEVI 1 Device by Does not apply route daily as needed.   Continuous Blood Gluc Transmit (DEXCOM G6 TRANSMITTER) MISC CHANGE TRANSMITTER EVERY 90 DAYS   Continuous Glucose Sensor (DEXCOM G6 SENSOR) MISC Change sensor every 10 days.   FLUoxetine (PROZAC) 10 MG capsule Take 1 capsule (10 mg total) by mouth daily. Take with 20 mg tab for a total of 30 mg daily   FLUoxetine (PROZAC) 20 MG capsule Take 1 capsule (20 mg total) by mouth daily. Take with 10 mg tab for a total of 30 mg daily    Insulin Disposable Pump (OMNIPOD 5 G6 INTRO, GEN 5,) KIT Inject 1 Device into the skin as directed. Change pod every 2 days. This will be a 30 day supply. Please fill for [facility] [address]-95   Insulin Disposable Pump (OMNIPOD 5 G6 PODS, GEN 5,) MISC INJECT 1 DEVICE INTO THE SKIN AS DIRECTED. CHANGE POD EVERY 2 DAYS.   insulin lispro (HUMALOG) 100 UNIT/ML injection INJECT 300 UNITS INTO PUMP EVERY 48 HOURS   insulin glargine (LANTUS SOLOSTAR) 100 UNIT/ML Solostar Pen Inject up to 50 units per day as directed. Please keep on file for back up in case insulin pump malfunctions. (Patient not taking: Reported on 03/02/2022)   ondansetron (ZOFRAN) 4 MG tablet Take 1 tablet (4 mg total) by mouth every 6 (six) hours. (Patient not taking: Reported on 03/02/2022)   [DISCONTINUED] cetirizine (ZYRTEC ALLERGY) 10 MG tablet Take 1 tablet (10 mg total) by mouth daily. (Patient not taking: No sig reported)   [DISCONTINUED] ipratropium (ATROVENT) 0.06 % nasal spray Place 2 sprays into both nostrils 4 (four) times daily. (Patient not taking: No sig reported)   [DISCONTINUED] NOVOPEN ECHO DEVI Use to inject insulin 4-5x day (Patient not taking: No sig reported)   No facility-administered encounter medications on file as of 10/24/2022.    Allergies: No Known Allergies  Surgical History: Past Surgical History:  Procedure Laterality Date   ROOT CANAL     WISDOM TOOTH EXTRACTION      Family History:  Mother has Type 1 diabetes diagnosed at age 4.    Social History: Lives with: Parents and sister   Works as an Lexicographer Exam:  Vitals:   10/24/22 1051  BP: 122/72  Pulse: 74  Weight: 141 lb 9.6 oz (64.2 kg)       BP 122/72   Pulse 74   Wt 141 lb 9.6 oz (64.2 kg)   BMI 19.61 kg/m  Body mass index: body mass index is 19.61 kg/m. Growth %ile SmartLinks can only be used for patients less than 12 years old.  Ht Readings from Last 3 Encounters:  04/08/20 5' 11.26" (1.81 m) (75%, Z= 0.67)*   01/08/20 5' 11.1" (1.806 m) (74%, Z= 0.63)*  10/06/19 5' 10.87" (1.8 m) (72%, Z= 0.57)*   * Growth percentiles are based on CDC (Boys, 2-20 Years) data.   Wt Readings from Last 3 Encounters:  10/24/22 141 lb 9.6 oz (64.2 kg)  06/01/22 142 lb 12.8 oz (64.8 kg)  03/02/22 147 lb (66.7 kg)   Physical Exam  ]  General: Well developed, well nourished male in no acute distress.   Head: Normocephalic, atraumatic.   Eyes:  Pupils equal and round. EOMI.  Sclera white.  No eye drainage.   Ears/Nose/Mouth/Throat: Nares patent, no nasal drainage.  Normal dentition, mucous membranes moist.  Neck: supple, no cervical lymphadenopathy, no thyromegaly Cardiovascular: regular rate, normal S1/S2, no murmurs Respiratory: No increased work of breathing.  Lungs clear to auscultation bilaterally.  No wheezes. Abdomen: soft, nontender, nondistended. Normal bowel sounds.  No appreciable masses  Extremities: warm, well perfused, cap refill < 2 sec.   Musculoskeletal: Normal muscle mass.  Normal strength Skin: warm, dry.  No rash or lesions. Neurologic: alert and oriented, normal speech, no tremor    Labs:  Results for orders placed or performed in visit on 10/24/22  POCT glycosylated hemoglobin (Hb A1C)  Result Value Ref Range   Hemoglobin A1C 7.5 (A) 4.0 - 5.6 %   HbA1c POC (<> result, manual entry)     HbA1c, POC (prediabetic range)     HbA1c, POC (controlled diabetic range)    POCT Glucose (Device for Home Use)  Result Value Ref Range   Glucose Fasting, POC 160 (A) 70 - 99 mg/dL   POC Glucose       Assessment/Plan: Shaylon is a 22 y.o. male with type 1 diabetes on Omnipod 5 insulin pump and Dexcom CGM. He has a pattern of hyperglycemia between 6pm-1am which usually occurs due to a late/missed bolus. Hemoglobin A1c is 7.5% which is slightly higher then ADA goal of <7%.   1. Type 1 Diabetes  (HCC) 2. Hyperglycemia - Reviewed insulin pump and CGM download. Discussed trends and patterns.  -  Rotate pump sites to prevent scar tissue.  - bolus 15 minutes prior to eating to limit blood sugar spikes.  - Reviewed carb counting and importance of accurate carb counting.  - Discussed signs and symptoms of hypoglycemia. Always have glucose available.  - POCT glucose and hemoglobin A1c  - Reviewed growth chart.  - Encouraged to bolus for all carb intake.  - Discussed importance of good diabetes care with a high risk job such as Personnel officer  - Advised he is due for annual eye exam and stressed importance.   3. Insulin pump titration  Pump in place.   Follow-up: 3 months.   LOS: >30  spent today reviewing the medical chart, counseling the patient/family, and documenting today's visit. When a patient is on insulin, intensive monitoring of blood glucose levels is necessary to avoid hyperglycemia and hypoglycemia. Severe hyperglycemia/hypoglycemia can lead to hospital admissions and be life threatening.    Gretchen Short, DNP, FNP-C  Pediatric Specialist  [address]  [place], [address]  Tele: [phone]

## 2022-10-24 NOTE — Addendum Note (Signed)
Addended by: Gretchen Short R on: 10/24/2022 11:27 AM   Modules accepted: Level of Service

## 2022-11-08 ENCOUNTER — Other Ambulatory Visit (HOSPITAL_COMMUNITY): Payer: Self-pay | Admitting: Psychiatry

## 2022-11-08 DIAGNOSIS — F331 Major depressive disorder, recurrent, moderate: Secondary | ICD-10-CM

## 2022-11-08 DIAGNOSIS — F411 Generalized anxiety disorder: Secondary | ICD-10-CM

## 2022-11-11 ENCOUNTER — Other Ambulatory Visit (HOSPITAL_COMMUNITY): Payer: Self-pay | Admitting: Psychiatry

## 2022-11-11 DIAGNOSIS — F411 Generalized anxiety disorder: Secondary | ICD-10-CM

## 2022-11-11 DIAGNOSIS — F331 Major depressive disorder, recurrent, moderate: Secondary | ICD-10-CM

## 2022-12-25 NOTE — Progress Notes (Deleted)
BH MD/PA/NP OP Progress Note  12/25/2022 10:03 AM Ethan Montoya  MRN:  [id number]  Visit Diagnosis:  No diagnosis found.  Assessment: Ethan Montoya is a 22 y.o. male with a history of diabtetes who presented to [facility] at [facility] for initial evaluation on 11/23/21.    On initial evaluation patient reported neurovegetative symptoms of depression including low mood, anhedonia, amotivation, fatigue, difficulty concentrating, and increased sleep.  He denied any SI/HI or thoughts of self-harm.  Patient did note that he has firearms that he keeps locked up, counseling was provided in relationship to this.  Patient denied any history of mania, auditory or visual hallucinations, paranoia, or delusions.  Of note patient does consume alcohol around 5 drinks a week and motivational interviewing techniques were used to encourage cessation.  Patient is at the precontemplation phase of abstinence.  Based on patient's presentation he met criteria for MDD and was started on Prozac.  Therapy was also discussed however patient declined at that time.  Ethan Montoya presents for follow-up evaluation. Today, 12/25/22, patient reports   some intermittent mood lability over the past 4 months though overall things have been stable.  There was a period of increased anxiety month ago though this is secondary to psychosocial stressors of starting a new job.  Patient is taking Prozac consistently denies any adverse side effects.  We will increase Prozac to 40 mg a day and reviewed the risk and benefits.  Also discussed as needed medications however patient declined at this time.  Will follow up in 2 months  Plan: - Increase Prozac to 40 mg daily for anxiety and depression - TSH, A1c, lipid profile reviewed - Crisis resources reviewed - Can consider therapy in the future - Follow up in 2 months   Chief Complaint:  No chief complaint on file.  HPI: Ethan Montoya presents reporting that     the past 4 months have gone alright.  He started a new job around 2 months ago.  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  The changes made as he makes more money in this field though it has been a bit stressful.  He is working around 60 hours a week and then attends school in the evening some days.  Patient works 6 days a week with Sundays being his day off.  With the busy schedule he finds himself sleeping a lot more due to exhaustion.  There was also a period around a month ago where he had increased anxiety with restlessness and racing thoughts however he believes this was related to stress with a new job.  Ethan Montoya notes that he has been sleeping well and his appetite has been good.  He has been taking his medication fairly consistently and does think it helps him feel better especially compared to how he has been in the past.  She also mentioned that is interested in further titration of the medication.  We also did discuss as needed medications however and felt that these were not needed at this time.  We will increase Prozac to 40 mg reviewed the risk and benefits.  Past Psychiatric History: Had been in therapy on and off since he was 9. Never noticed much difference. Was more so for anger when he was younger and he is not sure what it was for when he was 18.  Patient notes that he tried Zoloft 18 after connecting with a virtual psychiatrist and noticed an improvement in his  depression.  Eventually the medication started to have less effect potentially due to his missing doses as he started to feel better.  Dose was titrated up to 100 before patient discontinued the medication. Patient denies any prior psychiatric hospitalizations or suicide attempts.  Titrated Prozac to 30 mg QD on 12/22/21.  Patient had improved mood and anxiety on this dose however discontinued after a few months.  He restarted a around the beginning of May 2024  He reports alcohol use around 5 drinks a week  that started at age 80.  He notes nicotine use.  He denies any other substance use.  Past Medical History:  Past Medical History:  Diagnosis Date   Asthma    Diabetes mellitus without complication (HCC)     Past Surgical History:  Procedure Laterality Date   ROOT CANAL     WISDOM TOOTH EXTRACTION      Family Psychiatric History: mom has depression, and father has alcohol use disorder.  His grandparents and uncles are all on some medications for mental health but he is unsure what they are.  Family History:  Family History  Problem Relation Age of Onset   Healthy Mother    Healthy Father     Social History:  Social History   Socioeconomic History   Marital status: Single    Spouse name: Not on file   Number of children: Not on file   Years of education: Not on file   Highest education level: Not on file  Occupational History   Not on file  Tobacco Use   Smoking status: Some Days    Types: Cigarettes    Passive exposure: Yes   Smokeless tobacco: Never   Tobacco comments:    Pt smokes  Vaping Use   Vaping status: Never Used  Substance and Sexual Activity   Alcohol use: No   Drug use: Never   Sexual activity: Yes  Other Topics Concern   Not on file  Social History Narrative   Not on file   Social Determinants of Health   Financial Resource Strain: Not on file  Food Insecurity: Not on file  Transportation Needs: Not on file  Physical Activity: Not on file  Stress: Not on file  Social Connections: Not on file    Allergies: No Known Allergies  Current Medications: Current Outpatient Medications  Medication Sig Dispense Refill   Continuous Blood Gluc Receiver (DEXCOM G6 RECEIVER) DEVI 1 Device by Does not apply route daily as needed. 1 Device 1   Continuous Blood Gluc Transmit (DEXCOM G6 TRANSMITTER) MISC CHANGE TRANSMITTER EVERY 90 DAYS 1 each 2   Continuous Glucose Sensor (DEXCOM G6 SENSOR) MISC Change sensor every 10 days. 9 each 1   FLUoxetine  (PROZAC) 10 MG capsule Take 1 capsule (10 mg total) by mouth daily. Take with 20 mg tab for a total of 30 mg daily 30 capsule 0   FLUoxetine (PROZAC) 40 MG capsule Take 1 capsule (40 mg total) by mouth daily. 30 capsule 2   Insulin Disposable Pump (OMNIPOD 5 G6 INTRO, GEN 5,) KIT Inject 1 Device into the skin as directed. Change pod every 2 days. This will be a 30 day supply. Please fill for [facility] [id number] 1 kit 1   Insulin Disposable Pump (OMNIPOD 5 G6 PODS, GEN 5,) MISC INJECT 1 DEVICE INTO THE SKIN AS DIRECTED. CHANGE POD EVERY 2 DAYS. 15 each 4   insulin glargine (LANTUS SOLOSTAR) 100 UNIT/ML Solostar Pen Inject up to 50 units  per day as directed. Please keep on file for back up in case insulin pump malfunctions. (Patient not taking: Reported on 03/02/2022) 15 mL 3   insulin lispro (HUMALOG) 100 UNIT/ML injection INJECT 300 UNITS INTO PUMP EVERY 48 HOURS 130 mL 6   ondansetron (ZOFRAN) 4 MG tablet Take 1 tablet (4 mg total) by mouth every 6 (six) hours. (Patient not taking: Reported on 03/02/2022) 12 tablet 0   No current facility-administered medications for this visit.     Psychiatric Specialty Exam: Review of Systems  There were no vitals taken for this visit.There is no height or weight on file to calculate BMI.  General Appearance: Fairly Groomed  Eye Contact:  Good  Speech:  Clear and Coherent and Normal Rate  Volume:  Normal  Mood:  Anxious  Affect:  Congruent  Thought Process:  Coherent and Goal Directed  Orientation:  Full (Time, Place, and Person)  Thought Content: Logical   Suicidal Thoughts:  No  Homicidal Thoughts:  No  Memory:  NA  Judgement:  Fair  Insight:  Fair  Psychomotor Activity:  Normal  Concentration:  Concentration: Good  Recall:  Fair  Fund of Knowledge: Fair  Language: Good  Akathisia:  NA    AIMS (if indicated): not done  Assets:  Manufacturing systems engineer Housing Leisure Time Physical Health Talents/Skills Transportation  ADL's:  Intact   Cognition: WNL  Sleep:  Good   Metabolic Disorder Labs: Lab Results  Component Value Date   HGBA1C 7.5 (A) 10/24/2022   MPG 160 04/21/2021   MPG 183 07/21/2016   No results found for: "PROLACTIN" Lab Results  Component Value Date   CHOL 188 06/01/2022   TRIG 59 06/01/2022   HDL 41 06/01/2022   CHOLHDL 4.6 06/01/2022   LDLCALC 132 (H) 06/01/2022   LDLCALC 117 (H) 04/21/2021   Lab Results  Component Value Date   TSH 1.13 06/01/2022   TSH 0.82 04/21/2021    Therapeutic Level Labs: No results found for: "LITHIUM" No results found for: "VALPROATE" No results found for: "CBMZ"   Screenings: GAD-7    Flowsheet Row [facility]/23/20[facility] in [facility]  Total GAD-7 Score 5      PHQ2-9    Flowsheet Row Office Visit from 11/23/2021 in [facility] Video Visit from 04/05/2020 in [facility] at [facility]  PHQ-2 Total Score 3 2  PHQ-9 Total Score 7 12      Flowsheet Row ED from 01/25/2022 in [facility]) ED from 01/09/2022 in [facility] Office Visit from 11/23/2021 in [facility]  C-SSRS RISK CATEGORY No Risk No Risk No Risk       Collaboration of Care: Collaboration of Care: Medication Management AEB medication prescription and Other provider involved in patient's care AEB urgent care chart review  Patient/Guardian was advised Release of Information must be obtained prior to any record release in order to collaborate their care with an outside provider. Patient/Guardian was advised if they have not already done so to contact the registration department to sign all necessary forms in order for [place] to release information regarding their care.   Consent: Patient/Guardian gives verbal consent for treatment and assignment of  benefits for services provided during this visit. Patient/Guardian expressed understanding and agreed to proceed.    Stasia Cavalier, MD 12/25/2022, 10:03 AM   Virtual Visit via  Video Note  I connected with Valeda Malm on 12/25/22 at  4:30 PM EST by a video enabled telemedicine application and verified that I am speaking with the correct person using two identifiers.  Location: Patient: Home Provider: Home Office   I discussed the limitations of evaluation and management by telemedicine and the availability of in person appointments. The patient expressed understanding and agreed to proceed.   I discussed the assessment and treatment plan with the patient. The patient was provided an opportunity to ask questions and all were answered. The patient agreed with the plan and demonstrated an understanding of the instructions.   The patient was advised to call back or seek an in-person evaluation if the symptoms worsen or if the condition fails to improve as anticipated.  I provided 15 minutes of non-face-to-face time during this encounter.   Stasia Cavalier, MD

## 2022-12-26 ENCOUNTER — Telehealth (HOSPITAL_COMMUNITY): Payer: No Typology Code available for payment source | Admitting: Psychiatry

## 2022-12-31 ENCOUNTER — Encounter (INDEPENDENT_AMBULATORY_CARE_PROVIDER_SITE_OTHER): Payer: Self-pay

## 2022-12-31 DIAGNOSIS — E1065 Type 1 diabetes mellitus with hyperglycemia: Secondary | ICD-10-CM

## 2023-01-01 MED ORDER — DEXCOM G7 SENSOR MISC: 5 refills | Status: DC

## 2023-01-14 ENCOUNTER — Encounter (INDEPENDENT_AMBULATORY_CARE_PROVIDER_SITE_OTHER): Payer: Self-pay

## 2023-01-19 ENCOUNTER — Other Ambulatory Visit (HOSPITAL_COMMUNITY): Payer: Self-pay | Admitting: Psychiatry

## 2023-01-19 DIAGNOSIS — F411 Generalized anxiety disorder: Secondary | ICD-10-CM

## 2023-01-19 DIAGNOSIS — F331 Major depressive disorder, recurrent, moderate: Secondary | ICD-10-CM

## 2023-01-29 ENCOUNTER — Ambulatory Visit (INDEPENDENT_AMBULATORY_CARE_PROVIDER_SITE_OTHER): Payer: No Typology Code available for payment source | Admitting: Family

## 2023-01-29 NOTE — Progress Notes (Deleted)
Pediatric Endocrinology Diabetes Consultation Follow-up Visit  Ethan Montoya 2001-01-24 [id number]  Chief Complaint: Follow-up type 1 diabetes   Ethan Bathe, MD   HPI: Ethan Montoya  is a 22 y.o. male presenting for follow-up of type 1 diabetes. he is accompanied to this visit by his Mother .  1. Ethan Montoya presented to the ED on 07/20/16 with 2 days of increased polyuria/polydipsia and acute weight loss. He was on his second steroid burst for apparent contact dermatitis secondary to poison ivy. His mother has type 1 diabetes and recognized symptoms of diabetes. BG was 1,043 mg/dL without acidosis. There was >500 glucose in the urine but no ketones. He was admitted to the PICU for fluid resuscitation prior to initiation of insulin per hyperglycemia, non ketotic protocol.  In the PICU he had an observed seizure episode. He had a stat CT which was negative. They then thought perhaps seizure was a vagal response. He was transferred to the ward the next day. BG decreased with hydration to 400 and he was started on two component insulin with Lantus and Novolog at that time. 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   2. Since last visit to [facility] on 10/2022 , he has been well.  No ER visits or hospitalizations.  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 He is 30 mg of Prozac once per day, he feels like it has been helpful with depression and anxiety.   Wearing Omnipod 5 insulin pump and Dexcom G7, he has not had many failed pump sites other then [place] was [place] the [place]. He reports doing better with bolusing but needs to improve consistency. When he is busy at work he will decrease his bolus by about 10 grams of carbs. He is able to feel symptoms when his blood sugar is under 80. No severe hypoglycemia.    Insulin regimen:  Omnipod 5   If using injections --> 10 units of Lantus. Novolog 150/50/15 plan  Basal (Max: 0.6 units/hr) 12AM 0.45                         Total: 10.8 units per  day    Insulin to carbohydrate ratio (ICR)  12AM 25  9AM 17   11PM 20   5PM 12   9PM 12        Max Bolus: 14 units   Insulin Sensitivity Factor (ISF) 12AM 55                              Target BG & Correct Above BG 12AM 110                            Hypoglycemia: Able to feel low blood sugars.  No glucagon needed recently.  Blood glucose download: Did not bring meter  Omnipod Insulin pump downlaod    Med-alert ID: Not currently wearing. Injection sites: legs and arms  Annual labs due: 06/2022 Ophthalmology due: Discussed importance of annual eye exam today.     3. ROS: Greater than 10 systems reviewed with pertinent positives listed in HPI, otherwise neg. Constitutional: Sleeping well. Weight is stable.  Eyes: No changes in vision. No blurry vision  Ears/Nose/Mouth/Throat: No difficulty swallowing. Cardiovascular: No palpitations. No chest pain  Respiratory: No increased work of breathing. No SOB  Gastrointestinal: No constipation or diarrhea. No abdominal pain Genitourinary:  No nocturia, no polyuria Neurologic: Normal sensation, no tremor Endocrine: No polydipsia.  No hyperpigmentation Psychiatric: Normal affect  Past Medical History:   Past Medical History:  Diagnosis Date   Asthma    Diabetes mellitus without complication (HCC)     Medications:  Outpatient Encounter Medications as of 01/29/2023  Medication Sig   Continuous Blood Gluc Receiver (DEXCOM G6 RECEIVER) DEVI 1 Device by Does not apply route daily as needed.   Continuous Blood Gluc Transmit (DEXCOM G6 TRANSMITTER) MISC CHANGE TRANSMITTER EVERY 90 DAYS   Continuous Glucose Sensor (DEXCOM G6 SENSOR) MISC Change sensor every 10 days.   Continuous Glucose Sensor (DEXCOM G7 SENSOR) MISC Use 1 sensor as directed every 10 days to monitor glucose continuously.   FLUoxetine (PROZAC) 10 MG capsule Take 1 capsule (10 mg total) by mouth daily. Take with 20 mg tab for a total of 30 mg daily    FLUoxetine (PROZAC) 40 MG capsule TAKE 1 CAPSULE (40 MG TOTAL) BY MOUTH DAILY.   Insulin Disposable Pump (OMNIPOD 5 G6 INTRO, GEN 5,) KIT Inject 1 Device into the skin as directed. Change pod every 2 days. This will be a 30 day supply. Please fill for [facility] [id number]   Insulin Disposable Pump (OMNIPOD 5 G6 PODS, GEN 5,) MISC INJECT 1 DEVICE INTO THE SKIN AS DIRECTED. CHANGE POD EVERY 2 DAYS.   insulin glargine (LANTUS SOLOSTAR) 100 UNIT/ML Solostar Pen Inject up to 50 units per day as directed. Please keep on file for back up in case insulin pump malfunctions. (Patient not taking: Reported on 03/02/2022)   insulin lispro (HUMALOG) 100 UNIT/ML injection INJECT 300 UNITS INTO PUMP EVERY 48 HOURS   ondansetron (ZOFRAN) 4 MG tablet Take 1 tablet (4 mg total) by mouth every 6 (six) hours. (Patient not taking: Reported on 03/02/2022)   [DISCONTINUED] cetirizine (ZYRTEC ALLERGY) 10 MG tablet Take 1 tablet (10 mg total) by mouth daily. (Patient not taking: No sig reported)   [DISCONTINUED] ipratropium (ATROVENT) 0.06 % nasal spray Place 2 sprays into both nostrils 4 (four) times daily. (Patient not taking: No sig reported)   [DISCONTINUED] NOVOPEN ECHO DEVI Use to inject insulin 4-5x day (Patient not taking: No sig reported)   No facility-administered encounter medications on file as of 01/29/2023.    Allergies: No Known Allergies  Surgical History: Past Surgical History:  Procedure Laterality Date   ROOT CANAL     WISDOM TOOTH EXTRACTION      Family History:  Mother has Type 1 diabetes diagnosed at age 49.    Social History: Lives with: Parents and sister   Works as an Lexicographer Exam:  There were no vitals filed for this visit.      There were no vitals taken for this visit. Body mass index: body mass index is unknown because there is no height or weight on file. Growth %ile SmartLinks can only be used for patients less than 14 years old.  Ht Readings from Last 3  Encounters:  04/08/20 5' 11.26" (1.81 m) (75%, Z= 0.67)*  01/08/20 5' 11.1" (1.806 m) (74%, Z= 0.63)*  10/06/19 5' 10.87" (1.8 m) (72%, Z= 0.57)*   * Growth percentiles are based on CDC (Boys, 2-20 Years) data.   Wt Readings from Last 3 Encounters:  10/24/22 141 lb 9.6 oz (64.2 kg)  06/01/22 142 lb 12.8 oz (64.8 kg)  03/02/22 147 lb (66.7 kg)   Physical Exam  General: Well developed, well nourished male in no acute  distress. Head: Normocephalic, atraumatic.   Eyes:  Pupils equal and round. EOMI.  Sclera white.  No eye drainage.   Ears/Nose/Mouth/Throat: Nares patent, no nasal drainage.  Normal dentition, mucous membranes moist.  Neck: supple, no cervical lymphadenopathy, no thyromegaly Cardiovascular: regular rate, normal S1/S2, no murmurs Respiratory: No increased work of breathing.  Lungs clear to auscultation bilaterally.  No wheezes. Abdomen: soft, nontender, nondistended. Normal bowel sounds.  No appreciable masses  Extremities: warm, well perfused, cap refill < 2 sec.   Musculoskeletal: Normal muscle mass.  Normal strength Skin: warm, dry.  No rash or lesions. Neurologic: alert and oriented, normal speech, no tremor   Labs:  Results for orders placed or performed in visit on 10/24/22  POCT Glucose (Device for Home Use)   Collection Time: 10/24/22 10:55 AM  Result Value Ref Range   Glucose Fasting, POC 160 (A) 70 - 99 mg/dL   POC Glucose    POCT glycosylated hemoglobin (Hb A1C)   Collection Time: 10/24/22 10:59 AM  Result Value Ref Range   Hemoglobin A1C 7.5 (A) 4.0 - 5.6 %   HbA1c POC (<> result, manual entry)     HbA1c, POC (prediabetic range)     HbA1c, POC (controlled diabetic range)       Assessment/Plan: Ethan Montoya is a 22 y.o. male with type 1 diabetes on Omnipod 5 insulin pump and Dexcom CGM. He has a pattern of hyperglycemia between 6pm-1am which usually occurs due to a late/missed bolus. Hemoglobin A1c is 7.5% which is slightly higher then ADA goal of <7%.    1. Type 1 Diabetes  (HCC) 2. Hyperglycemia - Reviewed insulin pump and CGM download. Discussed trends and patterns.  - Rotate pump sites to prevent scar tissue.  - bolus 15 minutes prior to eating to limit blood sugar spikes.  - Reviewed carb counting and importance of accurate carb counting.  - Discussed signs and symptoms of hypoglycemia. Always have glucose available.  - POCT glucose and hemoglobin A1c  - Reviewed growth chart.    3. Insulin pump titration  Pump in place.   Follow-up: 3 months.   LOS: >30  spent today reviewing the medical chart, counseling the patient/family, and documenting today's visit. When a patient is on insulin, intensive monitoring of blood glucose levels is necessary to avoid hyperglycemia and hypoglycemia. Severe hyperglycemia/hypoglycemia can lead to hospital admissions and be life threatening.    Gretchen Short, DNP, FNP-C  Pediatric Specialist  [address]  [place], [address]  Tele: [phone]

## 2023-02-06 ENCOUNTER — Encounter (HOSPITAL_COMMUNITY): Payer: Self-pay | Admitting: Psychiatry

## 2023-02-06 ENCOUNTER — Telehealth (HOSPITAL_COMMUNITY): Payer: No Typology Code available for payment source | Admitting: Psychiatry

## 2023-02-06 DIAGNOSIS — F331 Major depressive disorder, recurrent, moderate: Secondary | ICD-10-CM | POA: Diagnosis not present

## 2023-02-06 DIAGNOSIS — F411 Generalized anxiety disorder: Secondary | ICD-10-CM

## 2023-02-06 MED ORDER — PROPRANOLOL HCL 10 MG PO TABS: 10.0000 mg | ORAL_TABLET | Freq: Two times a day (BID) | ORAL | 2 refills | Status: DC | PRN

## 2023-02-06 MED ORDER — FLUOXETINE HCL 40 MG PO CAPS: 40.0000 mg | ORAL_CAPSULE | Freq: Every day | ORAL | 0 refills | Status: DC

## 2023-02-06 NOTE — Progress Notes (Signed)
 BH MD/PA/NP OP Progress Note  02/06/2023 2:31 PM SHAUN RUNYON  MRN:  [id number]  Visit Diagnosis:    ICD-10-CM   1. GAD (generalized anxiety disorder)  F41.1 propranolol  (INDERAL ) 10 MG tablet    FLUoxetine  (PROZAC ) 40 MG capsule    2. Moderate episode of recurrent major depressive disorder (HCC)  F33.1 FLUoxetine  (PROZAC ) 40 MG capsule      Assessment: MONTFORD BARG is a 22 y.o. male with a history of diabtetes who presented to [facility] at [facility] for initial evaluation on 11/23/21.    On initial evaluation patient reported neurovegetative symptoms of depression including low mood, anhedonia, amotivation, fatigue, difficulty concentrating, and increased sleep.  He denied any SI/HI or thoughts of self-harm.  Patient did note that he has firearms that he keeps locked up, counseling was provided in relationship to this.  Patient denied any history of mania, auditory or visual hallucinations, paranoia, or delusions.  Of note patient does consume alcohol  around 5 drinks a week and motivational interviewing techniques were used to encourage cessation.  Patient is at the precontemplation phase of abstinence.  Based on patient's presentation he met criteria for MDD and was started on Prozac .  Therapy was also discussed however patient declined at that time.  Fairy GORMAN Nokes presents for follow-up evaluation. Today, 02/06/23, patient reports some mood improvement with Prozac  increased to 40.  He can still have intermittent periods of increased anxiety and depression that lasts around 3 to 4 days and occur every month or 2.  Patient was open to therapy referral and we will also start propranolol  10 mg twice a day to be taken as needed for anxiety during these times.  Of note patient has been working increased hours with limited time for self-care that is likely impacting mood symptoms.  We will continue Prozac  at 40 mg daily and follow up in 3 months.  Plan: - Continue  Prozac  to 40 mg daily for anxiety and depression - Start propranolol  10 mg BID prn for anxiety - TSH, A1c, lipid profile reviewed - Crisis resources reviewed - Therapy referral - Follow up in 3 months   Chief Complaint:  Chief Complaint  Patient presents with   Follow-up   HPI: Delois presents reporting that he has been getting by though has been stressed with work.  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  He is still working 60 hours a week and may be going up to 70 in the near future.  Patient is still attending school on top of this.  There is the option of switching to another company to do his apprenticeship however he prefers this 1 due to its proximity.  Patient feels like there is limited time outside of work for him to go to focus on his own self-care.  Jensen endorses Prozac  to still be helpful for his mood but can experience an episode of increased depression once every 1 to 2 months.  These episodes tend to last around 3 to 4 days before improving.  During this time he has worsening low mood, irritability, ruminations, and lack of desire to do anything.  He denies any thoughts of self-harm or suicide along with any signs of psychosis.  Did screening for mania patient denied any symptoms consistent with that.  During the most recent episode patient had talked to his family about getting a therapist.  We discussed this as  it had been recommended in the past and patient was open to referral.  We also discussed adding a as needed medication to be taken during periods of increased depression/rumination.  We will start on propranolol  and reviewed the risk and benefits.  Past Psychiatric History: Had been in therapy on and off since he was 9. Never noticed much difference. Was more so for anger when he was younger and he is not sure what it was for when he was 18.  Patient notes that he tried Zoloft  20 after  connecting with a virtual psychiatrist and noticed an improvement in his depression.  Eventually the medication started to have less effect potentially due to his missing doses as he started to feel better.  Dose was titrated up to 100 before patient discontinued the medication. Patient denies any prior psychiatric hospitalizations or suicide attempts.  Titrated Prozac  to 30 mg QD on 12/22/21.  Patient had improved mood and anxiety on this dose however discontinued after a few months.  He restarted a around the beginning of May 2024  He reports alcohol  use around 5 drinks a week that started at age 76.  He notes nicotine use.  He denies any other substance use.  Past Medical History:  Past Medical History:  Diagnosis Date   Asthma    Diabetes mellitus without complication (HCC)     Past Surgical History:  Procedure Laterality Date   ROOT CANAL     WISDOM TOOTH EXTRACTION      Family Psychiatric History: mom has depression, and father has alcohol  use disorder.  His grandparents and uncles are all on some medications for mental health but he is unsure what they are.  Family History:  Family History  Problem Relation Age of Onset   Healthy Mother    Healthy Father     Social History:  Social History   Socioeconomic History   Marital status: Single    Spouse name: Not on file   Number of children: Not on file   Years of education: Not on file   Highest education level: Not on file  Occupational History   Not on file  Tobacco Use   Smoking status: Some Days    Types: Cigarettes    Passive exposure: Yes   Smokeless tobacco: Never   Tobacco comments:    Pt smokes  Vaping Use   Vaping status: Never Used  Substance and Sexual Activity   Alcohol  use: No   Drug use: Never   Sexual activity: Yes  Other Topics Concern   Not on file  Social History Narrative   Not on file   Social Drivers of Health   Financial Resource Strain: Not on file  Food Insecurity: Not on file   Transportation Needs: Not on file  Physical Activity: Not on file  Stress: Not on file  Social Connections: Not on file    Allergies: No Known Allergies  Current Medications: Current Outpatient Medications  Medication Sig Dispense Refill   propranolol  (INDERAL ) 10 MG tablet Take 1 tablet (10 mg total) by mouth 2 (two) times daily as needed. 60 tablet 2   Continuous Blood Gluc Receiver (DEXCOM G6 RECEIVER) DEVI 1 Device by Does not apply route daily as needed. 1 Device 1   Continuous Blood Gluc Transmit (DEXCOM G6 TRANSMITTER) MISC CHANGE TRANSMITTER EVERY 90 DAYS 1 each 2   Continuous Glucose Sensor (DEXCOM G6 SENSOR) MISC Change sensor every 10 days. 9 each 1   Continuous Glucose Sensor (DEXCOM  G7 SENSOR) MISC Use 1 sensor as directed every 10 days to monitor glucose continuously. 3 each 5   FLUoxetine  (PROZAC ) 40 MG capsule Take 1 capsule (40 mg total) by mouth daily. 90 capsule 0   Insulin  Disposable Pump (OMNIPOD 5 G6 INTRO, GEN 5,) KIT Inject 1 Device into the skin as directed. Change pod every 2 days. This will be a 30 day supply. Please fill for [facility] [id number] 1 kit 1   Insulin  Disposable Pump (OMNIPOD 5 G6 PODS, GEN 5,) MISC INJECT 1 DEVICE INTO THE SKIN AS DIRECTED. CHANGE POD EVERY 2 DAYS. 15 each 4   insulin  glargine (LANTUS  SOLOSTAR) 100 UNIT/ML Solostar Pen Inject up to 50 units per day as directed. Please keep on file for back up in case insulin  pump malfunctions. (Patient not taking: Reported on 03/02/2022) 15 mL 3   insulin  lispro (HUMALOG ) 100 UNIT/ML injection INJECT 300 UNITS INTO PUMP EVERY 48 HOURS 130 mL 6   ondansetron  (ZOFRAN ) 4 MG tablet Take 1 tablet (4 mg total) by mouth every 6 (six) hours. (Patient not taking: Reported on 03/02/2022) 12 tablet 0   No current facility-administered medications for this visit.     Psychiatric Specialty Exam: Review of Systems  There were no vitals taken for this visit.There is no height or weight on file to calculate BMI.   General Appearance: Fairly Groomed  Eye Contact:  Good  Speech:  Clear and Coherent and Normal Rate  Volume:  Normal  Mood:  Anxious  Affect:  Congruent  Thought Process:  Coherent and Goal Directed  Orientation:  Full (Time, Place, and Person)  Thought Content: Logical   Suicidal Thoughts:  No  Homicidal Thoughts:  No  Memory:  NA  Judgement:  Fair  Insight:  Fair  Psychomotor Activity:  Normal  Concentration:  Concentration: Good  Recall:  Fair  Fund of Knowledge: Fair  Language: Good  Akathisia:  NA    AIMS (if indicated): not done  Assets:  Manufacturing Systems Engineer Housing Leisure Time Physical Health Talents/Skills Transportation  ADL's:  Intact  Cognition: WNL  Sleep:  Good   Metabolic Disorder Labs: Lab Results  Component Value Date   HGBA1C 7.5 (A) 10/24/2022   MPG 160 04/21/2021   MPG 183 07/21/2016   No results found for: PROLACTIN Lab Results  Component Value Date   CHOL 188 06/01/2022   TRIG 59 06/01/2022   HDL 41 06/01/2022   CHOLHDL 4.6 06/01/2022   LDLCALC 132 (H) 06/01/2022   LDLCALC 117 (H) 04/21/2021   Lab Results  Component Value Date   TSH 1.13 06/01/2022   TSH 0.82 04/21/2021    Therapeutic Level Labs: No results found for: LITHIUM No results found for: VALPROATE No results found for: CBMZ   Screenings: GAD-7    Flowsheet Row [facility]  Total GAD-7 Score 5      PHQ2-9    Flowsheet Row [facility] Visit [facility] 11/23/2021 [facility] Video Visit from 04/05/2020 in [facility] at [facility]  PHQ-2 Total Score 3 2  PHQ-9 Total Score 7 12      Flowsheet Row ED from 01/25/2022 in [facility] ED from 01/09/2022 in [facility] Visit [facility]/23[facility]20[facility]  C-SSRS RISK CATEGORY No Risk No Risk No Risk  Collaboration of Care: Collaboration of Care: Medication Management AEB medication prescription and Other provider involved in patient's care AEB primary care chart review  Patient/Guardian was advised Release of Information must be obtained prior to any record release in order to collaborate their care with an outside provider. Patient/Guardian was advised if they have not already done so to contact the registration department to sign all necessary forms in order for us  to release information regarding their care.   Consent: Patient/Guardian gives verbal consent for treatment and assignment of benefits for services provided during this visit. Patient/Guardian expressed understanding and agreed to proceed.    Arvella CHRISTELLA Finder, MD 02/06/2023, 2:31 PM   Virtual Visit via Video Note  I connected with Fairy Luna on 02/06/23 at  2:00 PM EST by a video enabled telemedicine application and verified that I am speaking with the correct person using two identifiers.  Location: Patient: Home Provider: Home Office   I discussed the limitations of evaluation and management by telemedicine and the availability of in person appointments. The patient expressed understanding and agreed to proceed.   I discussed the assessment and treatment plan with the patient. The patient was provided an opportunity to ask questions and all were answered. The patient agreed with the plan and demonstrated an understanding of the instructions.   The patient was advised to call back or seek an in-person evaluation if the symptoms worsen or if the condition fails to improve as anticipated.  I provided 15 minutes of non-face-to-face time during this encounter.   Arvella CHRISTELLA Finder, MD

## 2023-02-15 ENCOUNTER — Other Ambulatory Visit (INDEPENDENT_AMBULATORY_CARE_PROVIDER_SITE_OTHER): Payer: Self-pay | Admitting: Family

## 2023-02-21 ENCOUNTER — Encounter (INDEPENDENT_AMBULATORY_CARE_PROVIDER_SITE_OTHER): Payer: Self-pay

## 2023-03-09 ENCOUNTER — Telehealth (INDEPENDENT_AMBULATORY_CARE_PROVIDER_SITE_OTHER): Payer: Self-pay | Admitting: Family

## 2023-03-09 NOTE — Telephone Encounter (Signed)
Mom called and stated that Ethan Montoya DexcomG7 came off. She contacted dexcom and they were sending her one but when she contacted dexcom today the caller stated it has been a delay on the shipment. Mom is wondering if she can come and get a sample. She would like a callback at [phone].

## 2023-03-13 ENCOUNTER — Ambulatory Visit (HOSPITAL_COMMUNITY): Payer: No Typology Code available for payment source | Admitting: Licensed Clinical Social Worker

## 2023-03-13 ENCOUNTER — Encounter (INDEPENDENT_AMBULATORY_CARE_PROVIDER_SITE_OTHER): Payer: Self-pay

## 2023-03-13 NOTE — Telephone Encounter (Signed)
 Spoke with mom, ok per DPR. Mom stated he needs at least 1 sample due to him being short for 4 days. I did let mom know he does need an appointment due to not being seen since 10/24/22. Mom stated she did let him know and he is trying to figure out when he can due to having a busy schedule. I let mom know I will ask Sp[facility]er due to him not have been seen since September.

## 2023-04-02 ENCOUNTER — Other Ambulatory Visit (HOSPITAL_COMMUNITY): Payer: Self-pay

## 2023-04-02 ENCOUNTER — Telehealth (INDEPENDENT_AMBULATORY_CARE_PROVIDER_SITE_OTHER): Payer: Self-pay | Admitting: Family

## 2023-04-02 DIAGNOSIS — E1065 Type 1 diabetes mellitus with hyperglycemia: Secondary | ICD-10-CM

## 2023-04-02 MED ORDER — DEXCOM G7 SENSOR MISC
5 refills | Status: DC
  Filled 2023-04-02: qty 3, fill #0

## 2023-04-02 NOTE — Telephone Encounter (Signed)
 Says he is having trouble getting his sensors, pharmacy states there is a back order. Pt is wanting a call on what to do from here. [phone].

## 2023-04-02 NOTE — Telephone Encounter (Signed)
 Called patient back, updated that yes there is a Geneticist, molecular with some pharmacies.  [facility] is aware, he is welcome to call [facility], they may be able to send him some.  Also, [facility] has them in stock would he like me to send the script there.  He confirmed yes.  I told him he may have to pick them up or they may be able to ship to him, he can ask when they call about the refill.  He verbalized understanding.

## 2023-04-03 ENCOUNTER — Other Ambulatory Visit (HOSPITAL_COMMUNITY): Payer: Self-pay

## 2023-04-21 ENCOUNTER — Other Ambulatory Visit (INDEPENDENT_AMBULATORY_CARE_PROVIDER_SITE_OTHER): Payer: Self-pay | Admitting: Family

## 2023-04-25 ENCOUNTER — Encounter (HOSPITAL_COMMUNITY): Payer: Self-pay

## 2023-04-25 ENCOUNTER — Ambulatory Visit (HOSPITAL_COMMUNITY)
Admission: EM | Admit: 2023-04-25 | Discharge: 2023-04-25 | Disposition: A | Discharge status: Home / Self Care | Attending: Nurse Practitioner | Admitting: Nurse Practitioner

## 2023-04-25 DIAGNOSIS — S46812A Strain of other muscles, fascia and tendons at shoulder and upper arm level, left arm, initial encounter: Secondary | ICD-10-CM

## 2023-04-25 MED ORDER — CYCLOBENZAPRINE HCL 10 MG PO TABS: 10.0000 mg | ORAL_TABLET | Freq: Two times a day (BID) | ORAL | 0 refills | Status: AC | PRN

## 2023-04-25 MED ORDER — KETOROLAC TROMETHAMINE 30 MG/ML IJ SOLN
30.0000 mg | Freq: Once | INTRAMUSCULAR | Status: AC
  Administered 2023-04-25: 30 mg via INTRAMUSCULAR

## 2023-04-25 MED ORDER — KETOROLAC TROMETHAMINE 30 MG/ML IJ SOLN
INTRAMUSCULAR | Status: AC
  Filled 2023-04-25: qty 1

## 2023-04-25 NOTE — ED Provider Notes (Signed)
 [facility]    CSN: [id number] Arrival date & time: 04/25/23  1233      History   Chief Complaint Chief Complaint  Patient presents with   shoulder blade pain    HPI Ethan Montoya is a 23 y.o. male.   Patient presents today with sharp pain to bilateral shoulder blades that began when he woke up this morning.  Reports the pain behind the right shoulder blade has resolved and he is still having pain and left shoulder pain.  Denies recent fall, trauma, or known injury.  No recent heavy lifting or pushing pulling any heavy objects out of the ordinary. No weakness in upper extremities or decrease sensation of upper extremities.  No swelling or bruising.  Pain is worse when the patient looks down or when he uses his left arm. He took Tylenol at 5 AM this morning and it did help with the pain a little bit.      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 Authorizing Provider  cyclobenzaprine (FLEXERIL) 10 MG tablet Take 1 tablet (10 mg total) by mouth 2 (two) times daily as needed for muscle spasms. Do not take with alcohol or while driving or operating heavy machinery.  May cause drowsiness. 04/25/23  Yes Valentino Nose, NP  Continuous Blood Gluc Receiver (DEXCOM G6 RECEIVER) DEVI 1 Device by Does not apply route daily as needed. 10/07/18   Gretchen Short, NP   Continuous Blood Gluc Transmit (DEXCOM G6 TRANSMITTER) MISC CHANGE TRANSMITTER EVERY 90 DAYS 09/27/21   Gretchen Short, NP  Continuous Glucose Sensor (DEXCOM G6 SENSOR) MISC Change sensor every 10 days. 06/01/22   Gretchen Short, NP  Continuous Glucose Sensor (DEXCOM G7 SENSOR) MISC Use 1 sensor as directed every 10 days to monitor glucose continuously. 04/02/23   Gretchen Short, NP  FLUoxetine (PROZAC) 40 MG capsule Take 1 capsule (40 mg total) by mouth daily. 02/06/23 02/06/24  Stasia Cavalier, MD  Insulin Disposable Pump (OMNIPOD 5 DEXG7G6 PODS GEN 5) MISC INJECT 1 DEVICE INTO THE SKIN AS DIRECTED. CHANGE POD EVERY 2 DAYS. 04/23/23   Gretchen Short, NP  Insulin Disposable Pump (OMNIPOD 5 G6 INTRO, GEN 5,) KIT Inject 1 Device into the skin as directed. Change pod every 2 days. This will be a 30 day supply. Please fill for [facility] [id number] 10/18/20   Ethan Needle, MD  insulin glargine (LANTUS SOLOSTAR) 100 UNIT/ML Solostar Pen Inject up to 50 units per day as directed. Please keep on file for back up in case insulin pump malfunctions. Patient not taking: Reported on 03/02/2022 11/11/20   Ethan Needle, MD  insulin lispro (HUMALOG) 100 UNIT/ML injection INJECT 300 UNITS INTO PUMP EVERY 48 HOURS 08/21/22   Gretchen Short, NP  ondansetron (ZOFRAN) 4 MG tablet Take 1 tablet (4  mg total) by mouth every 6 (six) hours. Patient not taking: Reported on 03/02/2022 01/09/22   Ward, Tylene Fantasia, PA-C  propranolol (INDERAL) 10 MG tablet Take 1 tablet (10 mg total) by mouth 2 (two) times daily as needed. 02/06/23   Stasia Cavalier, MD  cetirizine (ZYRTEC ALLERGY) 10 MG tablet Take 1 tablet (10 mg total) by mouth daily. Patient not taking: No sig reported 10/14/19 04/08/20  Wallis Bamberg, PA-C  ipratropium (ATROVENT) 0.06 % nasal spray Place 2 sprays into both nostrils 4 (four) times daily. Patient not taking: No sig reported 02/10/18 04/08/20  Belinda Fisher, PA-C  NOVOPEN ECHO DEVI Use to inject insulin  4-5x day Patient not taking: No sig reported 11/07/18 04/08/20  Gretchen Short, NP    Family History Family History  Problem Relation Age of Onset   Healthy Mother    Healthy Father     Social History Social History   Tobacco Use   Smoking status: Some Days    Types: Cigarettes    Passive exposure: Yes   Smokeless tobacco: Never   Tobacco comments:    Pt smokes  Vaping Use   Vaping status: Every Day   Substances: Nicotine, Flavoring  Substance Use Topics   Alcohol use: No   Drug use: Never     Allergies   Patient has no known allergies.   Review of Systems Review of Systems Per HPI  Physical Exam Triage Vital Signs ED Triage Vitals  Encounter Vitals Group     BP 04/25/23 1330 103/75     Systolic BP Percentile --      Diastolic BP Percentile --      Pulse Rate 04/25/23 1330 81     Resp 04/25/23 1330 14     Temp 04/25/23 1330 97.6 F (36.4 C)     Temp Source 04/25/23 1330 Oral     SpO2 04/25/23 1330 96 %     Weight --      Height --      Head Circumference --      Peak Flow --      Pain Score 04/25/23 1329 6     Pain Loc --      Pain Education --      Exclude from Growth Chart --    No data found.  Updated Vital Signs BP 103/75 (BP Location: Left Arm)   Pulse 81   Temp 97.6 F (36.4 C) (Oral)   Resp 14   SpO2 96%   Visual Acuity Right Eye Distance:   Left Eye Distance:   Bilateral Distance:    Right Eye Near:   Left Eye Near:    Bilateral Near:     Physical Exam Vitals and nursing note reviewed.  Constitutional:      General: He is not in acute distress.    Appearance: Normal appearance. He is not toxic-appearing.  Pulmonary:     Effort: Pulmonary effort is normal. No respiratory distress.  Musculoskeletal:       Arms:     Comments: Inspection: no swelling, bruising, obvious deformity or redness to left scapula Palpation: left scapula and trapezius are nontender to palpation diffusely; no obvious deformities palpated ROM: Full  ROM to neck, bilateral upper extremities Strength: 5/5 bilateral upper extremities Neurovascular: neurovascularly intact in distal bilateral upper extremities   Skin:    General: Skin is warm and dry.     Capillary Refill: Capillary refill takes less than 2 seconds.  Coloration: Skin is not jaundiced or pale.     Findings: No erythema.  Neurological:     Mental Status: He is alert and oriented to person, place, and time.  Psychiatric:        Behavior: Behavior is cooperative.      UC Treatments / Results  Labs (all labs ordered are listed, but only abnormal results are displayed) Labs Reviewed - No data to display  EKG   Radiology No results found.  Procedures Procedures (including critical care time)  Medications Ordered in UC Medications  ketorolac (TORADOL) 30 MG/ML injection 30 mg (30 mg Intramuscular Given 04/25/23 1416)    Initial Impression / Assessment and Plan / UC Course  I have reviewed the triage vital signs and the nursing notes.  Pertinent labs & imaging results that were available during my care of the patient were reviewed by me and considered in my medical decision making (see chart for details).   Patient is well-appearing, normotensive, afebrile, not tachycardic, not tachypneic, oxygenating well on room air.    1. Strain of left trapezius muscle, initial encounter No red flags and vital signs are stable Suspect muscular strain Toradol 30 mg IM given in urgent care today, continue Tylenol and avoid NSAIDs for 48 hours at home In addition, start light range of motion/stretching exercises and muscle relaxant medicine as needed as well as warm/cool compresses Work excuse provided  The patient was given the opportunity to ask questions.  All questions answered to their satisfaction.  The patient is in agreement to this plan.    Final Clinical Impressions(s) / UC Diagnoses   Final diagnoses:  Strain of left trapezius muscle, initial encounter      Discharge Instructions      We have given you an injection of a medication to help with pain and inflammation called Toradol.  Do not take any other NSAIDs (Aleve, Advil, ibuprofen, Motrin) for 48 hours.  You can continue Tylenol [phone] mg every 6 hours and start the Flexeril to help with muscular pain.  The Flexeril may make you sleepy, so do not take prior to driving or operating heavy machinery.  Also recommend light range of motion and stretching exercises.  Seek care if symptoms do not improve with treatment over the next 1-2 weeks.   ED Prescriptions     Medication Sig Dispense Auth. Provider   cyclobenzaprine (FLEXERIL) 10 MG tablet Take 1 tablet (10 mg total) by mouth 2 (two) times daily as needed for muscle spasms. Do not take with alcohol or while driving or operating heavy machinery.  May cause drowsiness. 20 tablet Valentino Nose, NP      PDMP not reviewed this encounter.   Valentino Nose, NP 04/25/23 1428

## 2023-04-25 NOTE — ED Triage Notes (Signed)
 Patient reports that he woke in the night with a sharp pain to the left shoulder blade area. Patient denies any injury or heavy lifting.  Patient states he took Tylenol at 05000 today.

## 2023-04-25 NOTE — Discharge Instructions (Signed)
 We have given you an injection of a medication to help with pain and inflammation called Toradol.  Do not take any other NSAIDs (Aleve, Advil, ibuprofen, Motrin) for 48 hours.  You can continue Tylenol [phone] mg every 6 hours and start the Flexeril to help with muscular pain.  The Flexeril may make you sleepy, so do not take prior to driving or operating heavy machinery.  Also recommend light range of motion and stretching exercises.  Seek care if symptoms do not improve with treatment over the next 1-2 weeks.

## 2023-04-26 ENCOUNTER — Ambulatory Visit: Payer: Self-pay

## 2023-04-30 NOTE — Progress Notes (Unsigned)
 [facility] MD/PA/NP OP Progress Note  05/03/2023 4:31 PM FAIZAAN FALLS  MRN:  [id number]  Visit Diagnosis:    ICD-10-CM   1. GAD (generalized anxiety disorder)  F41.1 FLUoxetine (PROZAC) 40 MG capsule    2. Moderate episode of recurrent major depressive disorder (HCC)  F33.1 FLUoxetine (PROZAC) 40 MG capsule     Assessment: KINGSTIN HEIMS is a 23 y.o. male with a history of diabtetes who presented to [facility] for initial evaluation on 11/23/21.    On initial evaluation patient reported neurovegetative symptoms of depression including low mood, anhedonia, amotivation, fatigue, difficulty concentrating, and increased sleep.  He denied any SI/HI or thoughts of self-harm.  Patient did note that he has firearms that he keeps locked up, counseling was provided in relationship to this.  Patient denied any history of mania, auditory or visual hallucinations, paranoia, or delusions.  Of note patient does consume alcohol around 5 drinks a week and motivational interviewing techniques were used to encourage cessation.  Patient is at the precontemplation phase of abstinence.  Based on patient's presentation he met criteria for MDD and was started on Prozac.  Therapy was also discussed however patient declined at that time.  Edison Simon Christoffel presents for follow-up evaluation. Today, 05/03/23, patient reports improvement in his mood symptoms over the last couple months.  He continues to take Prozac without any adverse side effects.  Notably patient did not trial of propranolol due to the improvement in mood and feeling the need for it.  He would like to hold onto it to consider this use in the future if mood or anxiety symptoms were to increase again.  We will continue on his current regimen and follow-up in 3 months.  Patient can reach out for therapy in the future if needed.  Plan: - Continue Prozac 40 mg daily for anxiety and depression - Continue propranolol 10 mg BID prn  for anxiety - TSH, A1c, lipid profile reviewed - Crisis resources reviewed - Therapy referral - Follow up in 3 months   Chief Complaint:  Chief Complaint  Patient presents with   Follow-up   HPI: Norman presents reporting that things are going fairly well for him.  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 Still feeling overtired, but handling it better than he did in the past.  Patient is unsure what is different though notes that his mood tends to fluctuate with a few good months followed by a bad 1.  The last bad months was back in January patient reports.  With the mood improving he reports not having tried the propranolol.  He does however continue to take the Prozac consistently and finds it beneficial.  Patient wants to hold onto the propranolol in case the anxiety and negative mood through return in the future.  Of note patient missed his last therapy appointment due to his work schedule.  Encouraged him to reach back out in the future if needed to reschedule.  Past Psychiatric History: Had been in therapy on and off since he was 9. Never noticed much difference. Was more so for anger when he was younger and he is not sure what it was for when he was 18.  Patient notes that he tried Zoloft 18 after connecting with a virtual psychiatrist and noticed an improvement in his depression.  Eventually the medication started to have less effect potentially due to his missing doses as he started to feel better.  Dose was titrated up to 100 before patient discontinued the medication. Patient denies any prior psychiatric hospitalizations or suicide attempts.  Titrated Prozac to 30 mg QD on 12/22/21.  Patient had improved mood and anxiety on this dose however discontinued after a few months.  He restarted a around the beginning of May 2024  He reports alcohol use around 5 drinks a week that started at age 52.  He notes nicotine use.  He denies any other substance use.  Past  Medical History:  Past Medical History:  Diagnosis Date   Asthma    Diabetes mellitus without complication (HCC)     Past Surgical History:  Procedure Laterality Date   ROOT CANAL     WISDOM TOOTH EXTRACTION      Family Psychiatric History: mom has depression, and father has alcohol use disorder.  His grandparents and uncles are all on some medications for mental health but he is unsure what they are.  Family History:  Family History  Problem Relation Age of Onset   Healthy Mother    Healthy Father     Social History:  Social History   Socioeconomic History   Marital status: Single    Spouse name: Not on file   Number of children: Not on file   Years of education: Not on file   Highest education level: Not on file  Occupational History   Not on file  Tobacco Use   Smoking status: Some Days    Types: Cigarettes    Passive exposure: Yes   Smokeless tobacco: Never   Tobacco comments:    Pt smokes  Vaping Use   Vaping status: Every Day   Substances: Nicotine, Flavoring  Substance and Sexual Activity   Alcohol use: No   Drug use: Never   Sexual activity: Yes  Other Topics Concern   Not on file  Social History Narrative   Not on file   Social Drivers of Health   Financial Resource Strain: Not on file  Food Insecurity: Not on file  Transportation Needs: Not on file  Physical Activity: Not on file  Stress: Not on file  Social Connections: Not on file    Allergies: No Known Allergies  Current Medications: Current Outpatient Medications  Medication Sig Dispense Refill   Continuous Blood Gluc Receiver (DEXCOM G6 RECEIVER) DEVI 1 Device by Does not apply route daily as needed. 1 Device 1   Continuous Blood Gluc Transmit (DEXCOM G6 TRANSMITTER) MISC CHANGE TRANSMITTER EVERY 90 DAYS 1 each 2   Continuous Glucose Sensor (DEXCOM G6 SENSOR) MISC Change sensor every 10 days. 9 each 1   Continuous Glucose Sensor (DEXCOM G7 SENSOR) MISC Use 1 sensor as directed every  10 days to monitor glucose continuously. 3 each 5   cyclobenzaprine (FLEXERIL) 10 MG tablet Take 1 tablet (10 mg total) by mouth 2 (two) times daily as needed for muscle spasms. Do not take with alcohol or while driving or operating heavy machinery.  May cause drowsiness. 20 tablet 0   FLUoxetine (PROZAC) 40 MG capsule Take 1 capsule (40 mg total) by mouth daily. 90 capsule 0   Insulin Disposable Pump (OMNIPOD 5 DEXG7G6 PODS GEN 5) MISC INJECT 1 DEVICE INTO THE SKIN AS DIRECTED. CHANGE POD EVERY 2 DAYS. 15 each 0   Insulin Disposable Pump (OMNIPOD 5 G6 INTRO, GEN 5,) KIT Inject 1 Device into the skin as directed. Change pod every 2 days. This will be a 30 day supply. Please fill for [facility] [id number]  1 kit 1   insulin glargine (LANTUS SOLOSTAR) 100 UNIT/ML Solostar Pen Inject up to 50 units per day as directed. Please keep on file for back up in case insulin pump malfunctions. (Patient not taking: Reported on 03/02/2022) 15 mL 3   insulin lispro (HUMALOG) 100 UNIT/ML injection INJECT 300 UNITS INTO PUMP EVERY 48 HOURS 130 mL 6   ondansetron (ZOFRAN) 4 MG tablet Take 1 tablet (4 mg total) by mouth every 6 (six) hours. (Patient not taking: Reported on 03/02/2022) 12 tablet 0   propranolol (INDERAL) 10 MG tablet Take 1 tablet (10 mg total) by mouth 2 (two) times daily as needed. 60 tablet 2   No current facility-administered medications for this visit.     Psychiatric Specialty Exam: Review of Systems  There were no vitals taken for this visit.There is no height or weight on file to calculate BMI.  General Appearance: Fairly Groomed  Eye Contact:  Good  Speech:  Clear and Coherent and Normal Rate  Volume:  Normal  Mood:  Anxious  Affect:  Congruent  Thought Process:  Coherent and Goal Directed  Orientation:  Full (Time, Place, and Person)  Thought Content: Logical   Suicidal Thoughts:  No  Homicidal Thoughts:  No  Memory:  NA  Judgement:  Fair  Insight:  Fair  Psychomotor Activity:  Normal   Concentration:  Concentration: Good  Recall:  Fair  Fund of Knowledge: Fair  Language: Good  Akathisia:  NA    AIMS (if indicated): not done  Assets:  Manufacturing systems engineer Housing Leisure Time Physical Health Talents/Skills Transportation  ADL's:  Intact  Cognition: WNL  Sleep:  Good   Metabolic Disorder Labs: Lab Results  Component Value Date   HGBA1C 7.5 (A) 10/24/2022   MPG 160 04/21/2021   MPG 183 07/21/2016   No results found for: "PROLACTIN" Lab Results  Component Value Date   CHOL 188 06/01/2022   TRIG 59 06/01/2022   HDL 41 06/01/2022   CHOLHDL 4.6 06/01/2022   LDLCALC 132 (H) 06/01/2022   LDLCALC 117 (H) 04/21/2021   Lab Results  Component Value Date   TSH 1.13 06/01/2022   TSH 0.82 04/21/2021    Therapeutic Level Labs: No results found for: "LITHIUM" No results found for: "VALPROATE" No results found for: "CBMZ"   Screenings: GAD-7    Flowsheet Row [facility]  Total GAD-7 Score 5      PHQ2-9    Flowsheet Row Office Visit [facility] 11/23/2021 in [facility] Video Visit from 04/05/2020 in [facility] at [facility]  PHQ-2 Total Score 3 2  PHQ-9 Total Score 7 12      Flowsheet Row ED from 04/25/2023 in [facility] ED from 01[facility]25/2022 in [facility] ED from 01/09/2022 in [facility]  C-SSRS RISK CATEGORY No Risk No Risk No Risk       Collaboration of Care: Collaboration of Care: Medication Management AEB medication prescription and Other provider involved in patient's care AEB primary care and urgent care chart review  Patient/Guardian was advised Release of Information must be obtained prior to any record release in order to collaborate their care with an outside provider.  Patient/Guardian was advised if they have not already done so to contact the registration department to sign all necessary forms in order for [place] to release  information regarding their care.   Consent: Patient/Guardian gives verbal consent for treatment and assignment of benefits for services provided during this visit. Patient/Guardian expressed understanding and agreed to proceed.    Stasia Cavalier, MD 05/03/2023, 4:31 PM   Virtual Visit via Video Note  I connected with Valeda Malm on 05/03/23 at  4:00 PM EDT by a video enabled telemedicine application and verified that I am speaking with the correct person using two identifiers.  Location: Patient: Home Provider: Home Office   I discussed the limitations of evaluation and management by telemedicine and the availability of in person appointments. The patient expressed understanding and agreed to proceed.   I discussed the assessment and treatment plan with the patient. The patient was provided an opportunity to ask questions and all were answered. The patient agreed with the plan and demonstrated an understanding of the instructions.   The patient was advised to call back or seek an in-person evaluation if the symptoms worsen or if the condition fails to improve as anticipated.  I provided 15 minutes of non-face-to-face time during this encounter.   Stasia Cavalier, MD

## 2023-05-03 ENCOUNTER — Telehealth (HOSPITAL_COMMUNITY): Payer: No Typology Code available for payment source | Admitting: Psychiatry

## 2023-05-03 DIAGNOSIS — F411 Generalized anxiety disorder: Secondary | ICD-10-CM | POA: Diagnosis not present

## 2023-05-03 DIAGNOSIS — F331 Major depressive disorder, recurrent, moderate: Secondary | ICD-10-CM | POA: Diagnosis not present

## 2023-05-03 MED ORDER — FLUOXETINE HCL 40 MG PO CAPS: 40.0000 mg | ORAL_CAPSULE | Freq: Every day | ORAL | 0 refills | Status: DC

## 2023-05-04 ENCOUNTER — Encounter (HOSPITAL_COMMUNITY): Payer: Self-pay | Admitting: Psychiatry

## 2023-05-09 ENCOUNTER — Encounter (INDEPENDENT_AMBULATORY_CARE_PROVIDER_SITE_OTHER): Payer: Self-pay | Admitting: Family

## 2023-05-09 ENCOUNTER — Ambulatory Visit (INDEPENDENT_AMBULATORY_CARE_PROVIDER_SITE_OTHER): Payer: No Typology Code available for payment source | Admitting: Family

## 2023-05-09 VITALS — BP 112/84 | HR 91 | Wt 145.1 lb

## 2023-05-09 DIAGNOSIS — E1065 Type 1 diabetes mellitus with hyperglycemia: Secondary | ICD-10-CM | POA: Diagnosis not present

## 2023-05-09 DIAGNOSIS — Z4681 Encounter for fitting and adjustment of insulin pump: Secondary | ICD-10-CM | POA: Diagnosis not present

## 2023-05-09 LAB — POCT GLYCOSYLATED HEMOGLOBIN (HGB A1C): Hemoglobin A1C: 7.4 % — AB (ref 4.0–5.6)

## 2023-05-09 LAB — POCT GLUCOSE (DEVICE FOR HOME USE): Glucose Fasting, POC: 111 mg/dL — AB (ref 70–99)

## 2023-05-09 NOTE — Progress Notes (Signed)
 Pediatric Endocrinology Diabetes Consultation Follow-up Visit  Ethan CHUONG 01/07/2001 [id number]  Chief Complaint: Follow-up type 1 diabetes   Velvet Bathe, MD   HPI: Ethan Montoya  is a 23 y.o. male presenting for follow-up of type 1 diabetes. he is accompanied to this visit by his Mother .  1. Ethan Montoya presented to the ED on 07/20/16 with 2 days of increased polyuria/polydipsia and acute weight loss. He was on his second steroid burst for apparent contact dermatitis secondary to poison ivy. His mother has type 1 diabetes and recognized symptoms of diabetes. BG was 1,043 mg/dL without acidosis. There was >500 glucose in the urine but no ketones. He was admitted to the PICU for fluid resuscitation prior to initiation of insulin per hyperglycemia, non ketotic protocol.  In the PICU he had an observed seizure episode. He had a stat CT which was negative. They then thought perhaps seizure was a vagal response. He was transferred to the ward the next day. BG decreased with hydration to 400 and he was started on two component insulin with Lantus and Novolog at that time. 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   2. Since last visit to [facility] on 10/2022 , he has been well.  No ER visits or hospitalizations.  Reports he is doing well working at an [employer], he is in apprenticing. He is taking 30 mg of Prozac once daily and is followed by psychiatry. Goes fishing for activity.   Diabetes care has been ok but he acknowledges that " I have been slacking". Using Omnipod 5 insulin pump and Dexcom G6 CGM. Reports that he needs his max bolus increased because he is eating more carbs recently. Bolusing before eating. Estimates carb intake a meals is 120-130 grams per meal. Hypoglycemia does not occur often, none severe or requiring glucagon.     Insulin regimen:  Omnipod 5   If using injections --> 10 units of Lantus. Novolog 150/50/15 plan  Basal (Max: 0.6 units/hr) 12AM 0.45                          Total: 10.8 units per day    Insulin to carbohydrate ratio (ICR)  12AM 25  9AM 17   11PM 20   5PM 12   9PM 12        Max Bolus: 14 units   Insulin Sensitivity Factor (ISF) 12AM 55                              Target BG & Correct Above BG 12AM 110                            Hypoglycemia: Able to feel low blood sugars.  No glucagon needed recently.  Blood glucose download: Did not bring meter  Omnipod Insulin pump downlaod    Med-alert ID: Not currently wearing. Injection sites: legs and arms  Annual labs due: 06/2022 Ophthalmology due: Discussed importance of annual eye exam today. He over due.      3. ROS: Greater than 10 systems reviewed with pertinent positives listed in HPI, otherwise neg. All systems reviewed with pertinent positives listed below; otherwise negative. Constitutional: Weight as above.  Sleeping well HEENT: No vision changes. No difficulty swallowing.  Respiratory: No increased work of breathing currently GI: No constipation or diarrhea Musculoskeletal: No joint deformity Neuro: Normal affect. No tremors.  Endocrine: As above   Past Medical History:   Past Medical History:  Diagnosis Date   Asthma    Diabetes mellitus without complication (HCC)     Medications:  Outpatient Encounter Medications as of 05/09/2023  Medication Sig   Continuous Blood Gluc Receiver (DEXCOM G6 RECEIVER) DEVI 1 Device by Does not apply route daily as needed.   Continuous Blood Gluc Transmit (DEXCOM G6 TRANSMITTER) MISC CHANGE TRANSMITTER EVERY 90 DAYS   Continuous Glucose Sensor (DEXCOM G6 SENSOR) MISC Change sensor every 10 days.   Continuous Glucose Sensor (DEXCOM G7 SENSOR) MISC Use 1 sensor as directed every 10 days to monitor glucose continuously.   cyclobenzaprine (FLEXERIL) 10 MG tablet Take 1 tablet (10 mg total) by mouth 2 (two) times daily as needed for muscle spasms. Do not take with alcohol or while driving or operating heavy machinery.  May cause  drowsiness.   FLUoxetine (PROZAC) 40 MG capsule Take 1 capsule (40 mg total) by mouth daily.   Insulin Disposable Pump (OMNIPOD 5 DEXG7G6 PODS GEN 5) MISC INJECT 1 DEVICE INTO THE SKIN AS DIRECTED. CHANGE POD EVERY 2 DAYS.   Insulin Disposable Pump (OMNIPOD 5 G6 INTRO, GEN 5,) KIT Inject 1 Device into the skin as directed. Change pod every 2 days. This will be a 30 day supply. Please fill for [facility] [id number]   insulin glargine (LANTUS SOLOSTAR) 100 UNIT/ML Solostar Pen Inject up to 50 units per day as directed. Please keep on file for back up in case insulin pump malfunctions. (Patient not taking: Reported on 03/02/2022)   insulin lispro (HUMALOG) 100 UNIT/ML injection INJECT 300 UNITS INTO PUMP EVERY 48 HOURS   ondansetron (ZOFRAN) 4 MG tablet Take 1 tablet (4 mg total) by mouth every 6 (six) hours. (Patient not taking: Reported on 03/02/2022)   propranolol (INDERAL) 10 MG tablet Take 1 tablet (10 mg total) by mouth 2 (two) times daily as needed.   [DISCONTINUED] cetirizine (ZYRTEC ALLERGY) 10 MG tablet Take 1 tablet (10 mg total) by mouth daily. (Patient not taking: No sig reported)   [DISCONTINUED] ipratropium (ATROVENT) 0.06 % nasal spray Place 2 sprays into both nostrils 4 (four) times daily. (Patient not taking: No sig reported)   [DISCONTINUED] NOVOPEN ECHO DEVI Use to inject insulin 4-5x day (Patient not taking: No sig reported)   No facility-administered encounter medications on file as of 05/09/2023.    Allergies: No Known Allergies  Surgical History: Past Surgical History:  Procedure Laterality Date   ROOT CANAL     WISDOM TOOTH EXTRACTION      Family History:  Mother has Type 1 diabetes diagnosed at age 14.    Social History: Lives with: Parents and sister   Works as an Lexicographer Exam:  There were no vitals filed for this visit.  There were no vitals taken for this visit. Body mass index: body mass index is unknown because there is no height or weight on  file. Growth %ile SmartLinks can only be used for patients less than 21 years old.  Ht Readings from Last 3 Encounters:  04/08/20 5' 11.26" (1.81 m) (75%, Z= 0.67)*  01/08/20 5' 11.1" (1.806 m) (74%, Z= 0.63)*  10/06/19 5' 10.87" (1.8 m) (72%, Z= 0.57)*   * Growth percentiles are based on CDC (Boys, 2-20 Years) data.   Wt Readings from Last 3 Encounters:  10/24/22 141 lb 9.6 oz (64.2 kg)  06/01/22 142 lb 12.8 oz (64.8 kg)  03/02/22 147 lb (66.7  kg)   Physical Exam   General: Well developed, well nourished male in no acute distress.   Head: Normocephalic, atraumatic.   Eyes:  Pupils equal and round. EOMI.  Sclera white.  No eye drainage.   Ears/Nose/Mouth/Throat: Nares patent, no nasal drainage.  Normal dentition, mucous membranes moist.  Neck: supple, no cervical lymphadenopathy, no thyromegaly Cardiovascular: regular rate, normal S1/S2, no murmurs Respiratory: No increased work of breathing.  Lungs clear to auscultation bilaterally.  No wheezes. Abdomen: soft, nontender, nondistended. Normal bowel sounds.  No appreciable masses  Extremities: warm, well perfused, cap refill < 2 sec.   Musculoskeletal: Normal muscle mass.  Normal strength Skin: warm, dry.  No rash or lesions. Neurologic: alert and oriented, normal speech, no tremor  Labs:  Results for orders placed or performed in visit on 10/24/22  POCT Glucose (Device for Home Use)   Collection Time: 10/24/22 10:55 AM  Result Value Ref Range   Glucose Fasting, POC 160 (A) 70 - 99 mg/dL   POC Glucose    POCT glycosylated hemoglobin (Hb A1C)   Collection Time: 10/24/22 10:59 AM  Result Value Ref Range   Hemoglobin A1C 7.5 (A) 4.0 - 5.6 %   HbA1c POC (<> result, manual entry)     HbA1c, POC (prediabetic range)     HbA1c, POC (controlled diabetic range)       Assessment/Plan: Ethan Montoya is a 23 y.o. male with type 1 diabetes on Omnipod 5 insulin pump and Dexcom CGM. Pattern of hyperglycemia between 6pm-11pm. Hemoglobin A1c  is 7.4%, higher then ADA goal of <7%. Time in target range is 54%, goal is >70%.   1. Type 1 Diabetes  (HCC) 2. Hyperglycemia - Reviewed insulin pump and CGM download. Discussed trends and patterns.  - Rotate pump sites to prevent scar tissue.  - bolus 15 minutes prior to eating to limit blood sugar spikes.  - Reviewed carb counting and importance of accurate carb counting.  - Discussed signs and symptoms of hypoglycemia. Always have glucose available.  - POCT glucose and hemoglobin A1c  - Reviewed growth chart.  - Annual labs at next visit.  - Advised to have annual eye exam completed.  - Discussed transition to adult endocrinology after his next visit.   3. Insulin pump titration  Basal (Max: 0.6 units/hr) 12AM 0.45--> 0.55   12pm 0.45--> 0.55                       Total: 13.2 units per day    Insulin to carbohydrate ratio (ICR)  12AM 25  9AM 17   11PM 20   5PM 10--> 9   9PM 12 --> 10        Max Bolus: 20  units   Insulin Sensitivity Factor (ISF) 12AM 55   12pm  50                        Target BG & Correct Above BG 12AM 110                             Follow-up: 3 months.   LOS: 49 minutes spent today reviewing the medical chart, counseling the patient/family, and documenting today's visit. This time does not include CGM interpretation.  When a patient is on insulin, intensive monitoring of blood glucose levels is necessary to avoid hyperglycemia and hypoglycemia. Severe hyperglycemia/hypoglycemia can lead to hospital admissions and  be life threatening.    Gretchen Short, DNP, FNP-C  Pediatric Specialist  [address]  [place], [address]  Tele: [phone]

## 2023-05-09 NOTE — Patient Instructions (Signed)
 Basal (Max: 2 units/hr) 12AM 0.45--> 0.55   12pm 0.45--> 0.55                       Total: 13.2 units per day    Insulin to carbohydrate ratio (ICR)  12AM 25  9AM 17   11PM 20   5PM 10--> 9   9PM 12 --> 10        Max Bolus: 20  units   Insulin Sensitivity Factor (ISF) 12AM 55   12pm  50                        Target BG & Correct Above BG 12AM 110

## 2023-05-15 ENCOUNTER — Encounter (INDEPENDENT_AMBULATORY_CARE_PROVIDER_SITE_OTHER): Payer: Self-pay

## 2023-05-22 ENCOUNTER — Other Ambulatory Visit (INDEPENDENT_AMBULATORY_CARE_PROVIDER_SITE_OTHER): Payer: Self-pay | Admitting: Family

## 2023-05-28 ENCOUNTER — Encounter (INDEPENDENT_AMBULATORY_CARE_PROVIDER_SITE_OTHER): Payer: Self-pay

## 2023-06-15 ENCOUNTER — Encounter (INDEPENDENT_AMBULATORY_CARE_PROVIDER_SITE_OTHER): Payer: Self-pay

## 2023-06-20 ENCOUNTER — Other Ambulatory Visit (HOSPITAL_COMMUNITY): Payer: Self-pay | Admitting: Psychiatry

## 2023-06-20 DIAGNOSIS — F411 Generalized anxiety disorder: Secondary | ICD-10-CM

## 2023-06-29 ENCOUNTER — Other Ambulatory Visit (INDEPENDENT_AMBULATORY_CARE_PROVIDER_SITE_OTHER): Payer: Self-pay | Admitting: Family

## 2023-06-29 DIAGNOSIS — E1065 Type 1 diabetes mellitus with hyperglycemia: Secondary | ICD-10-CM

## 2023-07-11 ENCOUNTER — Ambulatory Visit (INDEPENDENT_AMBULATORY_CARE_PROVIDER_SITE_OTHER): Admitting: Family

## 2023-07-11 ENCOUNTER — Encounter (INDEPENDENT_AMBULATORY_CARE_PROVIDER_SITE_OTHER): Payer: Self-pay | Admitting: Family

## 2023-07-11 VITALS — BP 120/80 | HR 94 | Wt 150.0 lb

## 2023-07-11 DIAGNOSIS — E1065 Type 1 diabetes mellitus with hyperglycemia: Secondary | ICD-10-CM | POA: Diagnosis not present

## 2023-07-11 DIAGNOSIS — Z4681 Encounter for fitting and adjustment of insulin pump: Secondary | ICD-10-CM | POA: Diagnosis not present

## 2023-07-11 MED ORDER — OMNIPOD 5 DEXG7G6 PODS GEN 5 MISC: 5 refills | Status: DC

## 2023-07-11 MED ORDER — INSULIN LISPRO 100 UNIT/ML IJ SOLN: INTRAMUSCULAR | 1 refills | Status: DC

## 2023-07-11 MED ORDER — DEXCOM G7 SENSOR MISC: 5 refills | Status: DC

## 2023-07-11 NOTE — Progress Notes (Signed)
 Pediatric Endocrinology Diabetes Consultation Follow-up Visit  Ethan Montoya [birth year] [id number]  Chief Complaint: Follow-up type 1 diabetes   Ethan Milroy, MD   HPI: Ethan Montoya  is a 23 y.o. male presenting for follow-up of type 1 diabetes. he is accompanied to this visit by his Mother .  1. Ethan Montoya presented to the ED on 07/20/16 with 2 days of increased polyuria/polydipsia and acute weight loss. He was on his second steroid burst for apparent contact dermatitis secondary to poison ivy. His mother has type 1 diabetes and recognized symptoms of diabetes. BG was 1,043 mg/dL without acidosis. There was >500 glucose in the urine but no ketones. He was admitted to the PICU for fluid resuscitation prior to initiation of insulin  per hyperglycemia, non ketotic protocol.  In the PICU he had an observed seizure episode. He had a stat CT which was negative. They then thought perhaps seizure was a vagal response. He was transferred to the ward the next day. BG decreased with hydration to 400 and he was started on two component insulin  with Lantus  and Novolog  at that time. GAD and insulin  antibodies were positive.   2. Since last visit to [facility] on 10/2022 , he has been well.  No ER visits or hospitalizations.  He is taking 40 Prozac  once daily, managed by psych. Feels like he is doing well.   He feels like his blood sugars have improved recently. He did have one failed pump site which caused his blood sugars to run high. He reports consistently bolusing before eating, occasionally forgets at dinner time. Averages 100 grams of carbs per meal. Hypoglycemia has only occurred " a couple times to the 70's". None severe or requiring glucagon .   Insulin  regimen:  Omnipod 5 Basal (Max: 2.0  units/hr) 12AM 0.55  12pm  0.55                       Total: 13.6 units per day    Insulin  to carbohydrate ratio (ICR)  12AM 25  9AM 17   11PM 16  5PM 9  9PM 10        Max Bolus:20  units   Insulin  Sensitivity  Factor (ISF) 12AM 55    12pm  45                        Target BG & Correct Above BG 12AM 110                            Hypoglycemia: Able to feel low blood sugars.  No glucagon  needed recently.  Blood glucose download: Did not bring meter  Omnipod Insulin  pump downlaod    Med-alert ID: Not currently wearing. Injection sites: legs and arms  Annual labs due: 06/2022 Ophthalmology due: Discussed importance of annual eye exam today. He over due.      3. ROS: Greater than 10 systems reviewed with pertinent positives listed in HPI, otherwise neg. Constitutional:  Sleeping well HEENT: No vision changes. No difficulty swallowing.  Respiratory: No increased work of breathing currently GI: No constipation or diarrhea Musculoskeletal: No joint deformity Neuro: Normal affect. No tremors.  Endocrine: As above   Past Medical History:   Past Medical History:  Diagnosis Date   Asthma    Diabetes mellitus without complication (HCC)     Medications:  Outpatient Encounter Medications as of 07/11/2023  Medication Sig   cyclobenzaprine  (FLEXERIL )  10 MG tablet Take 1 tablet (10 mg total) by mouth 2 (two) times daily as needed for muscle spasms. Do not take with alcohol  or while driving or operating heavy machinery.  May cause drowsiness.   FLUoxetine  (PROZAC ) 40 MG capsule Take 1 capsule (40 mg total) by mouth daily.   [DISCONTINUED] Continuous Glucose Sensor (DEXCOM G7 SENSOR) MISC Use 1 sensor as directed every 10 days to monitor glucose continuously.   [DISCONTINUED] Insulin  Disposable Pump (OMNIPOD 5 DEXG7G6 PODS GEN 5) MISC INJECT 1 DEVICE INTO THE SKIN AS DIRECTED. CHANGE POD EVERY 2 DAYS.   [DISCONTINUED] insulin  lispro (HUMALOG ) 100 UNIT/ML injection INJECT 300 UNITS INTO PUMP EVERY 48 HOURS   Continuous Glucose Sensor (DEXCOM G7 SENSOR) MISC Use 1 sensor as directed every 10 days to monitor glucose continuously.   Insulin  Disposable Pump (OMNIPOD 5 DEXG7G6 PODS GEN 5) MISC  INJECT 1 DEVICE INTO THE SKIN AS DIRECTED. CHANGE POD EVERY 2 DAYS.   Insulin  Disposable Pump (OMNIPOD 5 G6 INTRO, GEN 5,) KIT Inject 1 Device into the skin as directed. Change pod every 2 days. This will be a 30 day supply. Please fill for [facility] [id number] (Patient not taking: Reported on 07/11/2023)   insulin  glargine (LANTUS  SOLOSTAR) 100 UNIT/ML Solostar Pen Inject up to 50 units per day as directed. Please keep on file for back up in case insulin  pump malfunctions. (Patient not taking: Reported on 03/02/2022)   insulin  lispro (HUMALOG ) 100 UNIT/ML injection Inject 300 units into pump every 48 hours   ondansetron  (ZOFRAN ) 4 MG tablet Take 1 tablet (4 mg total) by mouth every 6 (six) hours. (Patient not taking: Reported on 07/11/2023)   propranolol  (INDERAL ) 10 MG tablet Take 1 tablet (10 mg total) by mouth 2 (two) times daily as needed. (Patient not taking: Reported on 07/11/2023)   [DISCONTINUED] cetirizine  (ZYRTEC  ALLERGY) 10 MG tablet Take 1 tablet (10 mg total) by mouth daily. (Patient not taking: No sig reported)   [DISCONTINUED] Continuous Blood Gluc Receiver (DEXCOM G6 RECEIVER) DEVI 1 Device by Does not apply route daily as needed. (Patient not taking: Reported on 07/11/2023)   [DISCONTINUED] Continuous Blood Gluc Transmit (DEXCOM G6 TRANSMITTER) MISC CHANGE TRANSMITTER EVERY 90 DAYS (Patient not taking: Reported on 07/11/2023)   [DISCONTINUED] Continuous Glucose Sensor (DEXCOM G6 SENSOR) MISC Change sensor every 10 days. (Patient not taking: Reported on 07/11/2023)   [DISCONTINUED] ipratropium (ATROVENT ) 0.06 % nasal spray Place 2 sprays into both nostrils 4 (four) times daily. (Patient not taking: No sig reported)   [DISCONTINUED] NOVOPEN ECHO DEVI Use to inject insulin  4-5x day (Patient not taking: No sig reported)   No facility-administered encounter medications on file as of 07/11/2023.    Allergies: No Known Allergies  Surgical History: Past Surgical History:  Procedure Laterality Date    ROOT CANAL     WISDOM TOOTH EXTRACTION      Family History:  Mother has Type 1 diabetes diagnosed at age 33.    Social History: Lives with: Parents and sister   Works as an Lexicographer Exam:  Vitals:   07/11/23 1354  BP: 120/80  Pulse: 94  Weight: 150 lb (68 kg)    BP 120/80 (BP Location: Left Arm, Patient Position: Sitting, Cuff Size: Normal)   Pulse 94   Wt 150 lb (68 kg)   BMI 20.77 kg/m  Body mass index: body mass index is 20.77 kg/m. Growth %ile SmartLinks can only be used for patients less than 73 years old.  Ht Readings from Last 3 Encounters:  04/08/20 5' 11.26" (1.81 m) (75%, Z= 0.67)*  01/08/20 5' 11.1" (1.806 m) (74%, Z= 0.63)*  10/06/19 5' 10.87" (1.8 m) (72%, Z= 0.57)*   * Growth percentiles are based on CDC (Boys, 2-20 Years) data.   Wt Readings from Last 3 Encounters:  07/11/23 150 lb (68 kg)  05/09/23 145 lb 1.6 oz (65.8 kg)  10/24/22 141 lb 9.6 oz (64.2 kg)   Physical Exam   General: Well developed, well nourished male in no acute distress.   Head: Normocephalic, atraumatic.   Eyes:  Pupils equal and round. EOMI.  Sclera white.  No eye drainage.   Ears/Nose/Mouth/Throat: Nares patent, no nasal drainage.  Normal dentition, mucous membranes moist.  Neck: supple, no cervical lymphadenopathy, no thyromegaly Cardiovascular: regular rate, normal S1/S2, no murmurs Respiratory: No increased work of breathing.  Lungs clear to auscultation bilaterally.  No wheezes. Abdomen: soft, nontender, nondistended. Normal bowel sounds.  No appreciable masses  Extremities: warm, well perfused, cap refill < 2 sec.   Musculoskeletal: Normal muscle mass.  Normal strength Skin: warm, dry.  No rash or lesions. Neurologic: alert and oriented, normal speech, no tremor  Labs:  Results for orders placed or performed in visit on 05/09/23  POCT glycosylated hemoglobin (Hb A1C)   Collection Time: 05/09/23 10:50 AM  Result Value Ref Range   Hemoglobin A1C 7.4  (A) 4.0 - 5.6 %   HbA1c POC (<> result, manual entry)     HbA1c, POC (prediabetic range)     HbA1c, POC (controlled diabetic range)    POCT Glucose (Device for Home Use)   Collection Time: 05/09/23 10:50 AM  Result Value Ref Range   Glucose Fasting, POC 111 (A) 70 - 99 mg/dL   POC Glucose       Assessment/Plan: Verlin is a 23 y.o. male with type 1 diabetes on Omnipod 5 insulin  pump and Dexcom CGM. Insulin  pump download shows he is using more basal insulin  and needs pump adjustment. He also has pattern of post prandial hyperglycemia after lunch and dinner. Time in target range is 44%, goal is >70%.   1. Type 1 Diabetes  (HCC) 2. Hyperglycemia - Reviewed insulin  pump and CGM download. Discussed trends and patterns.  - Rotate pump sites to prevent scar tissue.  - bolus 15 minutes prior to eating to limit blood sugar spikes.  - Reviewed carb counting and importance of accurate carb counting.  - Discussed signs and symptoms of hypoglycemia. Always have glucose available.  - POCT glucose and hemoglobin A1c  - Reviewed growth chart.  - Discussed transition to adult endocrinology. Discussed local options for [facility] and [facility]. Referral placed per request.   3. Insulin  pump titration  Basal (Max:  2 units/hr) 12AM 0.55 --> 0.70   12pm  0.55 --> 0.75                       Total: 17.4 units per day    Insulin  to carbohydrate ratio (ICR)  12AM 25  9AM 17   11PM 16 --> 13  5PM 9 --> 8   9PM 10 --> 9        Max Bolus: 20  units   Insulin  Sensitivity Factor (ISF) 12AM 55   12pm 45                        Target BG & Correct Above BG 12AM 110  Follow-up: 3 months.   When a patient is on insulin , intensive monitoring of blood glucose levels is necessary to avoid hyperglycemia and hypoglycemia. Severe hyperglycemia/hypoglycemia can lead to hospital admissions and be life threatening.    Candee Cha, DNP, FNP-C  Pediatric  Specialist  [address]  [place], [address]  Tele: [phone]

## 2023-07-11 NOTE — Patient Instructions (Addendum)
 Basal (Max:  2 units/hr) 12AM 0.55 --> 0.70   12pm  0.55 --> 0.75                       Total: 17.4 units per day    Insulin  to carbohydrate ratio (ICR)  12AM 25  9AM 17   11PM 16 --> 13  5PM 9 --> 8   9PM 10 --> 9        Max Bolus: 20  units   Insulin  Sensitivity Factor (ISF) 12AM 55   12pm  50                        Target BG & Correct Above BG 12AM 110                             Back up plan   - Lantus  17 units   Carb ratio: 1:10   ISF: 45  Target: 120 day and 150 night

## 2023-07-23 NOTE — Progress Notes (Unsigned)
 BH MD/PA/NP OP Progress Note  07/26/2023 9:20 AM Ethan Montoya  MRN:  [id number]  Visit Diagnosis:    ICD-10-CM   1. GAD (generalized anxiety disorder)  F41.1     2. Moderate episode of recurrent major depressive disorder (HCC)  F33.1       Assessment: Ethan Montoya is a 23 y.o. male with a history of diabtetes who presented to [facility] at [facility] for initial evaluation on 11/23/21.    On initial evaluation patient reported neurovegetative symptoms of depression including low mood, anhedonia, amotivation, fatigue, difficulty concentrating, and increased sleep.  He denied any SI/HI or thoughts of self-harm.  Patient did note that he has firearms that he keeps locked up, counseling was provided in relationship to this.  Patient denied any history of mania, auditory or visual hallucinations, paranoia, or delusions.  Of note patient does consume alcohol  around 5 drinks a week and motivational interviewing techniques were used to encourage cessation.  Patient is at the precontemplation phase of abstinence.  Based on patient's presentation he met criteria for MDD and was started on Prozac .  Therapy was also discussed however patient declined at that time.  Rogena Class Thain presents for follow-up evaluation. Today, 07/26/23, patient reports   improvement in his mood symptoms over the last couple months.  He continues to take Prozac  without any adverse side effects.  Notably patient did not trial of propranolol  due to the improvement in mood and feeling the need for it.  He would like to hold onto it to consider this use in the future if mood or anxiety symptoms were to increase again.  We will continue on his current regimen and follow-up in 3 months.  Patient can reach out for therapy in the future if needed.  Plan: - Continue Prozac  40 mg daily for anxiety and depression - Continue propranolol  10 mg BID prn for anxiety - TSH, A1c, lipid profile reviewed - Crisis  resources reviewed - Therapy referral - Follow up in 3 months   Chief Complaint:  Chief Complaint  Patient presents with   Follow-up   HPI: Ethan Montoya presents reporting that    things are going fairly well for him.  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 Still feeling overtired, but handling it better than he did in the past.  Patient is unsure what is different though notes that his mood tends to fluctuate with a few good months followed by a bad 1.  The last bad months was back in January patient reports.  With the mood improving he reports not having tried the propranolol .  He does however continue to take the Prozac  consistently and finds it beneficial.  Patient wants to hold onto the propranolol  in case the anxiety and negative mood through return in the future.  Of note patient missed his last therapy appointment due to his work schedule.  Encouraged him to reach back out in the future if needed to reschedule.  Past Psychiatric History: Had been in therapy on and off since he was 9. Never noticed much difference. Was more so for anger when he was younger and he is not sure what it was for when he was 18.  Patient notes that he tried Zoloft  20 after connecting with a virtual psychiatrist and noticed an improvement in his depression.  Eventually the medication started to have less effect potentially due to his missing doses as he started to feel better.  Dose  was titrated up to 100 before patient discontinued the medication. Patient denies any prior psychiatric hospitalizations or suicide attempts.  Titrated Prozac  to 30 mg QD on 12/22/21.  Patient had improved mood and anxiety on this dose however discontinued after a few months.  He restarted a around the beginning of May 2024  He reports alcohol  use around 5 drinks a week that started at age 62.  He notes nicotine use.  He denies any other substance use.  Past Medical History:  Past Medical History:  Diagnosis  Date   Asthma    Diabetes mellitus without complication (HCC)     Past Surgical History:  Procedure Laterality Date   ROOT CANAL     WISDOM TOOTH EXTRACTION      Family Psychiatric History: mom has depression, and father has alcohol  use disorder.  His grandparents and uncles are all on some medications for mental health but he is unsure what they are.  Family History:  Family History  Problem Relation Age of Onset   Healthy Mother    Healthy Father     Social History:  Social History   Socioeconomic History   Marital status: Single    Spouse name: Not on file   Number of children: Not on file   Years of education: Not on file   Highest education level: Not on file  Occupational History   Not on file  Tobacco Use   Smoking status: Some Days    Types: Cigarettes    Passive exposure: Yes   Smokeless tobacco: Never   Tobacco comments:    Pt smokes  Vaping Use   Vaping status: Every Day   Substances: Nicotine, Flavoring  Substance and Sexual Activity   Alcohol  use: No   Drug use: Never   Sexual activity: Yes  Other Topics Concern   Not on file  Social History Narrative   Works at [employer] alone    Attends [employer]   Social Drivers of Corporate investment banker Strain: Not on file  Food Insecurity: Not on file  Transportation Needs: Not on file  Physical Activity: Not on file  Stress: Not on file  Social Connections: Not on file    Allergies: No Known Allergies  Current Medications: Current Outpatient Medications  Medication Sig Dispense Refill   Continuous Glucose Sensor (DEXCOM G7 SENSOR) MISC Use 1 sensor as directed every 10 days to monitor glucose continuously. 3 each 5   cyclobenzaprine  (FLEXERIL ) 10 MG tablet Take 1 tablet (10 mg total) by mouth 2 (two) times daily as needed for muscle spasms. Do not take with alcohol  or while driving or operating heavy machinery.  May cause drowsiness. 20 tablet 0   FLUoxetine  (PROZAC ) 40 MG capsule Take  1 capsule (40 mg total) by mouth daily. 90 capsule 0   Insulin  Disposable Pump (OMNIPOD 5 DEXG7G6 PODS GEN 5) MISC INJECT 1 DEVICE INTO THE SKIN AS DIRECTED. CHANGE POD EVERY 2 DAYS. 15 each 5   Insulin  Disposable Pump (OMNIPOD 5 G6 INTRO, GEN 5,) KIT Inject 1 Device into the skin as directed. Change pod every 2 days. This will be a 30 day supply. Please fill for [facility] [address]-40 (Patient not taking: Reported on 07/11/2023) 1 kit 1   insulin  glargine (LANTUS  SOLOSTAR) 100 UNIT/ML Solostar Pen Inject up to 50 units per day as directed. Please keep on file for back up in case insulin  pump malfunctions. (Patient not taking: Reported on 03/02/2022) 15 mL 3  insulin  lispro (HUMALOG ) 100 UNIT/ML injection Inject 300 units into pump every 48 hours 130 mL 1   ondansetron  (ZOFRAN ) 4 MG tablet Take 1 tablet (4 mg total) by mouth every 6 (six) hours. (Patient not taking: Reported on 07/11/2023) 12 tablet 0   propranolol  (INDERAL ) 10 MG tablet Take 1 tablet (10 mg total) by mouth 2 (two) times daily as needed. (Patient not taking: Reported on 07/11/2023) 60 tablet 2   No current facility-administered medications for this visit.     Psychiatric Specialty Exam: Review of Systems  There were no vitals taken for this visit.There is no height or weight on file to calculate BMI.  General Appearance: Fairly Groomed  Eye Contact:  Good  Speech:  Clear and Coherent and Normal Rate  Volume:  Normal  Mood:  Anxious  Affect:  Congruent  Thought Process:  Coherent and Goal Directed  Orientation:  Full (Time, Place, and Person)  Thought Content: Logical   Suicidal Thoughts:  No  Homicidal Thoughts:  No  Memory:  NA  Judgement:  Fair  Insight:  Fair  Psychomotor Activity:  Normal  Concentration:  Concentration: Good  Recall:  Fair  Fund of Knowledge: Fair  Language: Good  Akathisia:  NA    AIMS (if indicated): not done  Assets:  Manufacturing systems engineer Housing Leisure Time Physical  Health Talents/Skills Transportation  ADL's:  Intact  Cognition: WNL  Sleep:  Good   Metabolic Disorder Labs: Lab Results  Component Value Date   HGBA1C 7.4 (A) 05/09/2023   MPG 160 04/21/2021   MPG 183 07/21/2016   No results found for: PROLACTIN Lab Results  Component Value Date   CHOL 188 06/01/2022   TRIG 59 06/01/2022   HDL 41 06/01/2022   CHOLHDL 4.6 06/01/2022   LDLCALC 132 (H) 06/01/2022   LDLCALC 117 (H) 04/21/2021   Lab Results  Component Value Date   TSH 1.13 06/01/2022   TSH 0.82 04/21/2021    Therapeutic Level Labs: No results found for: LITHIUM No results found for: VALPROATE No results found for: CBMZ   Screenings: GAD-7    Flowsheet Row [facility]  Total GAD-7 Score 5   PHQ2-9    Flowsheet Row Office Visit from 11/23/2021 in [facility] Video Visit from 04/05/2020 in [facility] at [facility]  PHQ-2 Total Score 3 2  PHQ-9 Total Score 7 12   Flowsheet Row UC from 04/25/2023 in [facility] UC from 01/25/2022 in [facility] at [facility]) UC from 01/09/2022 in [facility] at [facility])  C-SSRS RISK CATEGORY No Risk No Risk No Risk    Collaboration of Care: Collaboration of Care: Medication Management AEB medication prescription and Other provider involved in patient's care AEB endocrinology chart review  Patient/Guardian was advised Release of Information must be obtained prior to any record release in order to collaborate their care with an outside provider. Patient/Guardian was advised if they have not already done so to contact the registration department to sign all necessary forms in order for us  to release information regarding their care.   Consent: Patient/Guardian gives verbal consent for treatment and  assignment of benefits for services provided during this visit. Patient/Guardian expressed understanding and agreed to proceed.    Yves Herb, MD 07/26/2023, 9:20 AM   Virtual Visit via Video Note  I connected with Drexel Gentles  Marlett on 07/26/23 at  4:00 PM EDT by a video enabled telemedicine application and verified that I am speaking with the correct person using two identifiers.  Location: Patient: Home Provider: Home Office   I discussed the limitations of evaluation and management by telemedicine and the availability of in person appointments. The patient expressed understanding and agreed to proceed.   I discussed the assessment and treatment plan with the patient. The patient was provided an opportunity to ask questions and all were answered. The patient agreed with the plan and demonstrated an understanding of the instructions.   The patient was advised to call back or seek an in-person evaluation if the symptoms worsen or if the condition fails to improve as anticipated.  I provided 15 minutes of non-face-to-face time during this encounter.   Yves Herb, MD

## 2023-07-26 ENCOUNTER — Encounter (HOSPITAL_COMMUNITY): Admitting: Psychiatry

## 2023-07-26 ENCOUNTER — Encounter (HOSPITAL_COMMUNITY): Payer: Self-pay

## 2023-07-26 NOTE — Progress Notes (Signed)
 This encounter was created in error - please disregard.  Patient did not show up for the appointment.  Appointment reminders were sent to patient's phone and email.  Attempted to call the patient at 4:07 but received no response. Left a HIPAA compliant voicemail for patient to reschedule.

## 2023-08-13 ENCOUNTER — Ambulatory Visit (INDEPENDENT_AMBULATORY_CARE_PROVIDER_SITE_OTHER): Admitting: Family

## 2023-08-21 ENCOUNTER — Encounter (HOSPITAL_COMMUNITY): Payer: Self-pay

## 2023-09-17 NOTE — Progress Notes (Signed)
 BH MD/PA/NP OP Progress Note  09/18/2023 3:15 PM [name]  MRN:  [id number]  Visit Diagnosis:    ICD-10-CM   1. GAD (generalized anxiety disorder)  F41.1 FLUoxetine (PROZAC) 20 MG capsule    propranolol (INDERAL) 10 MG tablet    FLUoxetine (PROZAC) 40 MG capsule    2. Moderate episode of recurrent major depressive disorder (HCC)  F33.1 FLUoxetine (PROZAC) 40 MG capsule     Assessment: Ethan Montoya is a 23 y.o. male with a history of diabtetes who presented to [facility] at [facility] for initial evaluation on 11/23/21.    On initial evaluation patient reported neurovegetative symptoms of depression including low mood, anhedonia, amotivation, fatigue, difficulty concentrating, and increased sleep.  He denied any SI/HI or thoughts of self-harm.  Patient did note that he has firearms that he keeps locked up, counseling was provided in relationship to this.  Patient denied any history of mania, auditory or visual hallucinations, paranoia, or delusions.  Of note patient does consume alcohol around 5 drinks a week and motivational interviewing techniques were used to encourage cessation.  Patient is at the precontemplation phase of abstinence.  Based on patient's presentation he met criteria for MDD and was started on Prozac.  Therapy was also discussed however patient declined at that time.  Ethan GORMAN Hoerner presents for follow-up evaluation. Today, 09/18/23, patient reports that mood is still relatively depressed though stable compared to his last visit.  There had been an episode of increased depression a month ago following his uncle's death.  Patient has gradually improved return to his former baseline a week ago.  He has been using propranolol more consistently during this time with good effect.  Patient is also taking the Prozac consistently and denies side effects from either medication.  We will titrate the Prozac to 60 mg today and reviewed the risk and benefits.   Patient also was provided with therapy resources and has an appointment coming up on 18 August.  We will follow up in 3 months.  Plan: - Increase Prozac 60 mg daily for anxiety and depression - Continue propranolol 10 mg BID prn for anxiety - TSH, A1c, lipid profile reviewed - Crisis resources reviewed - Therapy scheduled to start on 8/18 - Follow up in 3 months  Chief Complaint:  Chief Complaint  Patient presents with   Follow-up   HPI: Ethan Montoya presents reporting that he is about the same today.  There was a period in the interim where his uncle died from overdose.  Following that [name] was struggling more with his depression getting worse and then feeling more down in the dumps.  His mood has improved gradually and feels like he is back to his former baseline as of the last week.  That said this baseline is feeling constantly fatigued, amotivated, tired, and moderately depressed.  He denies any SI or thoughts of self-harm.  Decklin believes that his current baseline is 50% to depression and 50% to 2 his excessive work schedule.  Patient had reached out in the interim about connecting with a therapist and has an appointment coming up on the 18th.  He does report that he has been taking his medication consistently and started to use the as needed propranolol with good effect.  He denied any adverse side effects from the medicines.  We will titrate the Prozac to 60 mg daily and reviewed the risk and benefits.  Patient has also been working on quitting his alcohol use.  He has not had a drink in the past 2 weeks.  He has developed some better insight into this noting that when he does drink that his moods tend to get worse.  His uncle died a couple months ago from overdose, and it had left Ethan Montoya more down in the dumps. He has been a bit better the last week  Past Psychiatric History: Had been in therapy on and off since he was 9. Never noticed much difference. Was more so for anger when he was  younger and he is not sure what it was for when he was 18.  Patient notes that he tried Zoloft 18 after connecting with a virtual psychiatrist and noticed an improvement in his depression.  Eventually the medication started to have less effect potentially due to his missing doses as he started to feel better.  Dose was titrated up to 100 before patient discontinued the medication. Patient denies any prior psychiatric hospitalizations or suicide attempts.  He reports alcohol use around 5 drinks a week that started at age 76.  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  He notes nicotine use.  He denies any other substance use.  Past Medical History:  Past Medical History:  Diagnosis Date   Asthma    Diabetes mellitus without complication (HCC)     Past Surgical History:  Procedure Laterality Date   ROOT CANAL     WISDOM TOOTH EXTRACTION      Family Psychiatric History: mom has depression, and father has alcohol use disorder.  His grandparents and uncles are all on some medications for mental health but he is unsure what they are.  Family History:  Family History  Problem Relation Age of Onset   Healthy Mother    Healthy Father     Social History:  Social History   Socioeconomic History   Marital status: Single    Spouse name: Not on file   Number of children: Not on file   Years of education: Not on file   Highest education level: Not on file  Occupational History   Not on file  Tobacco Use   Smoking status: Some Days    Types: Cigarettes    Passive exposure: Yes   Smokeless tobacco: Never   Tobacco comments:    Pt smokes  Vaping Use   Vaping status: Every Day   Substances: Nicotine, Flavoring  Substance and Sexual Activity   Alcohol use: No   Drug use: Never   Sexual activity: Yes  Other Topics Concern   Not on file  Social History Narrative   Works at [employer] alone    Attends [employer]   Social Drivers of  Health   Financial Resource Strain: Not on file  Food Insecurity: Not on file  Transportation Needs: Not on file  Physical Activity: Not on file  Stress: Not on file  Social Connections: Not on file    Allergies: No Known Allergies  Current Medications: Current Outpatient Medications  Medication Sig Dispense Refill   FLUoxetine (PROZAC) 20 MG capsule Take 1 capsule (20 mg total) by mouth daily. 30 capsule 2   Continuous Glucose Sensor (DEXCOM G7 SENSOR) MISC Use 1 sensor as directed every 10 days to monitor glucose continuously. 3 each 5   cyclobenzaprine (FLEXERIL) 10 MG tablet Take 1 tablet (10 mg total) by mouth 2 (two) times daily as needed for muscle spasms. Do not take with alcohol or  while driving or operating heavy machinery.  May cause drowsiness. 20 tablet 0   FLUoxetine  (PROZAC ) 40 MG capsule Take 1 capsule (40 mg total) by mouth daily. 90 capsule 0   Insulin  Disposable Pump (OMNIPOD 5 DEXG7G6 PODS GEN 5) MISC INJECT 1 DEVICE INTO THE SKIN AS DIRECTED. CHANGE POD EVERY 2 DAYS. 15 each 5   Insulin  Disposable Pump (OMNIPOD 5 G6 INTRO, GEN 5,) KIT Inject 1 Device into the skin as directed. Change pod every 2 days. This will be a 30 day supply. Please fill for [facility] [id number] (Patient not taking: Reported on 07/11/2023) 1 kit 1   insulin  glargine (LANTUS  SOLOSTAR) 100 UNIT/ML Solostar Pen Inject up to 50 units per day as directed. Please keep on file for back up in case insulin  pump malfunctions. (Patient not taking: Reported on 03/02/2022) 15 mL 3   insulin  lispro (HUMALOG ) 100 UNIT/ML injection Inject 300 units into pump every 48 hours 130 mL 1   ondansetron  (ZOFRAN ) 4 MG tablet Take 1 tablet (4 mg total) by mouth every 6 (six) hours. (Patient not taking: Reported on 07/11/2023) 12 tablet 0   propranolol  (INDERAL ) 10 MG tablet Take 1 tablet (10 mg total) by mouth 2 (two) times daily as needed. 60 tablet 2   No current facility-administered medications for this visit.      Psychiatric Specialty Exam: Review of Systems  There were no vitals taken for this visit.There is no height or weight on file to calculate BMI.  General Appearance: Fairly Groomed  Eye Contact:  Good  Speech:  Clear and Coherent and Normal Rate  Volume:  Normal  Mood:  Depressed  Affect:  Congruent  Thought Process:  Coherent and Goal Directed  Orientation:  Full (Time, Place, and Person)  Thought Content: Logical   Suicidal Thoughts:  No  Homicidal Thoughts:  No  Memory:  NA  Judgement:  Fair  Insight:  Fair  Psychomotor Activity:  Normal  Concentration:  Concentration: Good  Recall:  Fair  Fund of Knowledge: Fair  Language: Good  Akathisia:  NA    AIMS (if indicated): not done  Assets:  Manufacturing systems engineer Housing Leisure Time Physical Health Talents/Skills Transportation  ADL's:  Intact  Cognition: WNL  Sleep:  Good   Metabolic Disorder Labs: Lab Results  Component Value Date   HGBA1C 7.4 (A) 05/09/2023   MPG 160 04/21/2021   MPG 183 07/21/2016   No results found for: PROLACTIN Lab Results  Component Value Date   CHOL 188 06/01/2022   TRIG 59 06/01/2022   HDL 41 06/01/2022   CHOLHDL 4.6 06/01/2022   LDLCALC 132 (H) 06/01/2022   LDLCALC 117 (H) 04/21/2021   Lab Results  Component Value Date   TSH 1.13 06/01/2022   TSH 0.82 04/21/2021    Therapeutic Level Labs: No results found for: LITHIUM No results found for: VALPROATE No results found for: CBMZ   Screenings: GAD-7    Flowsheet Row [facility]  Total GAD-7 Score 5   PHQ2-9    Flowsheet Row Office Visit from 11/23/2021 in [facility] Video Visit from 04/05/2020 in [facility] at [facility]  PHQ-2 Total Score 3 2  PHQ-9 Total Score 7 12   Flowsheet Row UC from 04/25/2023 in [facility] at [facility] UC from  01/25/2022 in [facility] at [facility] UC from 01/09/2022 in [facility]  at [facility])  C-SSRS RISK CATEGORY No Risk No Risk No Risk    Collaboration of Care: Collaboration of Care: Medication Management AEB medication prescription and Other provider involved in patient's care AEB primary care and urgent care chart review  Patient/Guardian was advised Release of Information must be obtained prior to any record release in order to collaborate their care with an outside provider. Patient/Guardian was advised if they have not already done so to contact the registration department to sign all necessary forms in order for us  to release information regarding their care.   Consent: Patient/Guardian gives verbal consent for treatment and assignment of benefits for services provided during this visit. Patient/Guardian expressed understanding and agreed to proceed.    Arvella CHRISTELLA Finder, MD 09/18/2023, 3:15 PM   Virtual Visit via Video Note  I connected with Ethan Montoya on 09/18/23 at  3:00 PM EDT by a video enabled telemedicine application and verified that I am speaking with the correct person using two identifiers.  Location: Patient: Home Provider: Home Office   I discussed the limitations of evaluation and management by telemedicine and the availability of in person appointments. The patient expressed understanding and agreed to proceed.   I discussed the assessment and treatment plan with the patient. The patient was provided an opportunity to ask questions and all were answered. The patient agreed with the plan and demonstrated an understanding of the instructions.   The patient was advised to call back or seek an in-person evaluation if the symptoms worsen or if the condition fails to improve as anticipated.  I provided 15 minutes of non-face-to-face time during this encounter.   Arvella CHRISTELLA Finder, MD

## 2023-09-18 ENCOUNTER — Encounter (HOSPITAL_COMMUNITY): Payer: Self-pay | Admitting: Psychiatry

## 2023-09-18 ENCOUNTER — Telehealth (HOSPITAL_COMMUNITY): Admitting: Psychiatry

## 2023-09-18 DIAGNOSIS — F411 Generalized anxiety disorder: Secondary | ICD-10-CM | POA: Diagnosis not present

## 2023-09-18 DIAGNOSIS — F331 Major depressive disorder, recurrent, moderate: Secondary | ICD-10-CM | POA: Diagnosis not present

## 2023-09-18 MED ORDER — FLUOXETINE HCL 40 MG PO CAPS: 40.0000 mg | ORAL_CAPSULE | Freq: Every day | ORAL | 0 refills | Status: DC

## 2023-09-18 MED ORDER — PROPRANOLOL HCL 10 MG PO TABS: 10.0000 mg | ORAL_TABLET | Freq: Two times a day (BID) | ORAL | 2 refills | Status: AC | PRN

## 2023-09-18 MED ORDER — FLUOXETINE HCL 20 MG PO CAPS: 20.0000 mg | ORAL_CAPSULE | Freq: Every day | ORAL | 2 refills | Status: DC

## 2023-12-17 NOTE — Progress Notes (Unsigned)
 BH MD/PA/NP OP Progress Note  12/18/2023 3:18 PM Ethan Montoya  MRN:  [id number]  Visit Diagnosis:    ICD-10-CM   1. GAD (generalized anxiety disorder)  F41.1 FLUoxetine  (PROZAC ) 20 MG capsule    FLUoxetine  (PROZAC ) 40 MG capsule    2. Moderate episode of recurrent major depressive disorder (HCC)  F33.1 FLUoxetine  (PROZAC ) 40 MG capsule      Assessment: Ethan Montoya is a 23 y.o. male with a history of diabtetes who presented to [facility] at [facility] for initial evaluation on 11/23/21.    On initial evaluation patient reported neurovegetative symptoms of depression including low mood, anhedonia, amotivation, fatigue, difficulty concentrating, and increased sleep.  He denied any SI/HI or thoughts of self-harm.  Patient did note that he has firearms that he keeps locked up, counseling was provided in relationship to this.  Patient denied any history of mania, auditory or visual hallucinations, paranoia, or delusions.  Of note patient does consume alcohol  around 5 drinks a week and motivational interviewing techniques were used to encourage cessation.  Patient is at the precontemplation phase of abstinence.  Based on patient's presentation he met criteria for MDD and was started on Prozac .  Therapy was also discussed however patient declined at that time.  Ethan Montoya presents for follow-up evaluation. Today, 12/18/23, patient has had improvement in depression and anxiety symptoms following titration of Prozac  and being laid off work.  There was an initial increase in anxiety after being laid off however this quickly resolved and patient has focus more on self-care with the free time.  He has not experienced any side effects from the increased dose of Prozac .  Patient connect with therapy for 2 sessions before therapist left.  Given the improvement in mood symptoms and connection with church does not feel further therapy referral is needed.  We will continue on  current regimen and follow-up in 3 months.  Plan: - Increase Prozac  60 mg daily for anxiety and depression - Continue propranolol  10 mg BID prn for anxiety - TSH, A1c, lipid profile reviewed - Crisis resources reviewed - Therapy scheduled to start on 8/18 - Follow up in 3 months  Chief Complaint:  Chief Complaint  Patient presents with   Follow-up   HPI: [name] presents reporting that he is doing good.  He ended up getting laid off around 2 months ago when the company he was working for ran out of work.  While it took time to get used to initially he thinks this was ultimately a good thing for him.  Now that is not working so many hours he has time to focus more on self-care.  He has been spending more time with his family and friends and even took some time to go to the beach and vacation.  [name] has gotten back involved in church and is going 3 times a week.  He is also still attending school and is going well.  He will occasionally pick up some side gigs to make a little money.  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  Overall he is enjoying the time off work.  [name] reports that he only has the occasional bad days now.  His uncle passed away due to overdose shortly before he was laid off.  When he thinks of this his mood can be a bit low.  These typically only last  for around a day before he improves.  He notes tolerating the Prozac  titration well and finds it helpful.  He denied any adverse side effects.  He notes he did try therapy twice before the therapist ended up leaving.  She also does not feel like there is a need to restart therapy since he is doing better and he has never been the biggest fan of it to begin with.  He also thinks church does a good job of taking its place.  Patient reports he is only use propranolol  few times in the interim after being laid off.  The first few days he was not sure what  to do with all the free time and was getting anxious.   Past Psychiatric History: Had been in therapy on and off since he was 9. Never noticed much difference. Was more so for anger when he was younger and he is not sure what it was for when he was 18.  Patient notes that he tried Zoloft  20 after connecting with a virtual psychiatrist and noticed an improvement in his depression.  Eventually the medication started to have less effect potentially due to his missing doses as he started to feel better.  Dose was titrated up to 100 before patient discontinued the medication. Patient denies any prior psychiatric hospitalizations or suicide attempts.  He reports alcohol  use around 5 drinks a week that started at age 67.  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    Past Medical History:  Past Medical History:  Diagnosis Date   Asthma    Diabetes mellitus without complication (HCC)     Past Surgical History:  Procedure Laterality Date   ROOT CANAL     WISDOM TOOTH EXTRACTION      Family Psychiatric History: mom has depression, and father has alcohol  use disorder.  His grandparents and uncles are all on some medications for mental health but he is unsure what they are.  Family History:  Family History  Problem Relation Age of Onset   Healthy Mother    Healthy Father     Social History:  Social History   Socioeconomic History   Marital status: Single    Spouse name: Not on file   Number of children: Not on file   Years of education: Not on file   Highest education level: Not on file  Occupational History   Not on file  Tobacco Use   Smoking status: Some Days    Types: Cigarettes    Passive exposure: Yes   Smokeless tobacco: Never   Tobacco comments:    Pt smokes  Vaping Use   Vaping status: Every Day   Substances: Nicotine, Flavoring  Substance and Sexual Activity   Alcohol  use: No   Drug use: Never   Sexual activity: Yes   Other Topics Concern   Not on file  Social History Narrative   Works at [employer] alone    Attends [employer]   Social Drivers of Corporate Investment Banker Strain: Not on file  Food Insecurity: Not on file  Transportation Needs: Not on file  Physical Activity: Not on file  Stress: Not on file  Social Connections: Not on file    Allergies: No Known Allergies  Current Medications: Current Outpatient Medications  Medication Sig Dispense Refill   Continuous Glucose Sensor (DEXCOM G7 SENSOR) MISC Use 1 sensor as directed every 10 days to monitor glucose  continuously. 3 each 5   cyclobenzaprine  (FLEXERIL ) 10 MG tablet Take 1 tablet (10 mg total) by mouth 2 (two) times daily as needed for muscle spasms. Do not take with alcohol  or while driving or operating heavy machinery.  May cause drowsiness. 20 tablet 0   FLUoxetine  (PROZAC ) 20 MG capsule Take 1 capsule (20 mg total) by mouth daily. 30 capsule 2   FLUoxetine  (PROZAC ) 40 MG capsule Take 1 capsule (40 mg total) by mouth daily. 90 capsule 0   Insulin  Disposable Pump (OMNIPOD 5 DEXG7G6 PODS GEN 5) MISC INJECT 1 DEVICE INTO THE SKIN AS DIRECTED. CHANGE POD EVERY 2 DAYS. 15 each 5   Insulin  Disposable Pump (OMNIPOD 5 G6 INTRO, GEN 5,) KIT Inject 1 Device into the skin as directed. Change pod every 2 days. This will be a 30 day supply. Please fill for [facility] [id number] (Patient not taking: Reported on 07/11/2023) 1 kit 1   insulin  glargine (LANTUS  SOLOSTAR) 100 UNIT/ML Solostar Pen Inject up to 50 units per day as directed. Please keep on file for back up in case insulin  pump malfunctions. (Patient not taking: Reported on 03/02/2022) 15 mL 3   insulin  lispro (HUMALOG ) 100 UNIT/ML injection Inject 300 units into pump every 48 hours 130 mL 1   ondansetron  (ZOFRAN ) 4 MG tablet Take 1 tablet (4 mg total) by mouth every 6 (six) hours. (Patient not taking: Reported on 07/11/2023) 12 tablet 0   propranolol  (INDERAL ) 10 MG tablet Take 1 tablet (10  mg total) by mouth 2 (two) times daily as needed. 60 tablet 2   No current facility-administered medications for this visit.     Psychiatric Specialty Exam: Review of Systems  There were no vitals taken for this visit.There is no height or weight on file to calculate BMI.  General Appearance: Fairly Groomed  Eye Contact:  Good  Speech:  Clear and Coherent and Normal Rate  Volume:  Normal  Mood:  Depressed  Affect:  Congruent  Thought Process:  Coherent and Goal Directed  Orientation:  Full (Time, Place, and Person)  Thought Content: Logical   Suicidal Thoughts:  No  Homicidal Thoughts:  No  Memory:  NA  Judgement:  Fair  Insight:  Fair  Psychomotor Activity:  Normal  Concentration:  Concentration: Good  Recall:  Fair  Fund of Knowledge: Fair  Language: Good  Akathisia:  NA    AIMS (if indicated): not done  Assets:  Manufacturing Systems Engineer Housing Leisure Time Physical Health Talents/Skills Transportation  ADL's:  Intact  Cognition: WNL  Sleep:  Good   Metabolic Disorder Labs: Lab Results  Component Value Date   HGBA1C 7.4 (A) 05/09/2023   MPG 160 04/21/2021   MPG 183 07/21/2016   No results found for: PROLACTIN Lab Results  Component Value Date   CHOL 188 06/01/2022   TRIG 59 06/01/2022   HDL 41 06/01/2022   CHOLHDL 4.6 06/01/2022   LDLCALC 132 (H) 06/01/2022   LDLCALC 117 (H) 04/21/2021   Lab Results  Component Value Date   TSH 1.13 06/01/2022   TSH 0.82 04/21/2021    Therapeutic Level Labs: No results found for: LITHIUM No results found for: VALPROATE No results found for: CBMZ   Screenings: GAD-7    Flowsheet Row [facility]  Total GAD-7 Score 5   PHQ2-9    Flowsheet Row Office Visit from 11/23/2021 in [facility] Video Visit from 04/05/2020 in [facility]  Outpatient [facility] at [facility]  PHQ-2  Total Score 3 2  PHQ-9 Total Score 7 12   Flowsheet Row UC from 04/25/2023 in [facility] UC from 01/25/2022 in [facility] at [facility] UC from 01/09/2022 in [facility] at [facility])  C-SSRS RISK CATEGORY No Risk No Risk No Risk    Collaboration of Care: Collaboration of Care: Medication Management AEB medication prescription and Other provider involved in patient's care AEB primary care and urgent care chart review  Patient/Guardian was advised Release of Information must be obtained prior to any record release in order to collaborate their care with an outside provider. Patient/Guardian was advised if they have not already done so to contact the registration department to sign all necessary forms in order for us  to release information regarding their care.   Consent: Patient/Guardian gives verbal consent for treatment and assignment of benefits for services provided during this visit. Patient/Guardian expressed understanding and agreed to proceed.    Arvella CHRISTELLA Finder, MD 12/18/2023, 3:18 PM   Virtual Visit via Video Note  I connected with Ethan Luna on 12/18/23 at  3:00 PM EST by a video enabled telemedicine application and verified that I am speaking with the correct person using two identifiers.  Location: Patient: Home Provider: Home Office   I discussed the limitations of evaluation and management by telemedicine and the availability of in person appointments. The patient expressed understanding and agreed to proceed.   I discussed the assessment and treatment plan with the patient. The patient was provided an opportunity to ask questions and all were answered. The patient agreed with the plan and demonstrated an understanding of the instructions.   The patient was advised to call back or seek an in-person evaluation if the symptoms worsen or if the condition fails to improve as anticipated.  I  provided 10 minutes of non-face-to-face time during this encounter.   Arvella CHRISTELLA Finder, MD

## 2023-12-18 ENCOUNTER — Other Ambulatory Visit (HOSPITAL_COMMUNITY): Payer: Self-pay

## 2023-12-18 ENCOUNTER — Telehealth (HOSPITAL_COMMUNITY): Admitting: Psychiatry

## 2023-12-18 ENCOUNTER — Other Ambulatory Visit: Payer: Self-pay

## 2023-12-18 ENCOUNTER — Encounter (HOSPITAL_COMMUNITY): Payer: Self-pay | Admitting: Psychiatry

## 2023-12-18 DIAGNOSIS — F411 Generalized anxiety disorder: Secondary | ICD-10-CM | POA: Diagnosis not present

## 2023-12-18 DIAGNOSIS — F331 Major depressive disorder, recurrent, moderate: Secondary | ICD-10-CM

## 2023-12-18 MED ORDER — FLUOXETINE HCL 20 MG PO CAPS
20.0000 mg | ORAL_CAPSULE | Freq: Every day | ORAL | 2 refills | Status: DC
  Filled 2023-12-18: qty 30, 30d supply, fill #0

## 2023-12-18 MED ORDER — FLUOXETINE HCL 40 MG PO CAPS
40.0000 mg | ORAL_CAPSULE | Freq: Every day | ORAL | 0 refills | Status: DC
  Filled 2023-12-18: qty 90, 90d supply, fill #0

## 2023-12-31 ENCOUNTER — Ambulatory Visit: Admitting: Endocrinology

## 2023-12-31 ENCOUNTER — Other Ambulatory Visit (HOSPITAL_COMMUNITY): Payer: Self-pay

## 2024-02-28 ENCOUNTER — Encounter: Payer: Self-pay | Admitting: Endocrinology

## 2024-02-28 ENCOUNTER — Ambulatory Visit: Payer: Self-pay | Admitting: Endocrinology

## 2024-02-28 ENCOUNTER — Ambulatory Visit: Admitting: Endocrinology

## 2024-02-28 ENCOUNTER — Other Ambulatory Visit

## 2024-02-28 ENCOUNTER — Other Ambulatory Visit (HOSPITAL_COMMUNITY): Payer: Self-pay

## 2024-02-28 VITALS — BP 128/70 | HR 87 | Resp 16 | Ht 71.0 in | Wt 160.8 lb

## 2024-02-28 DIAGNOSIS — E1065 Type 1 diabetes mellitus with hyperglycemia: Secondary | ICD-10-CM | POA: Diagnosis not present

## 2024-02-28 LAB — POCT GLYCOSYLATED HEMOGLOBIN (HGB A1C): Hemoglobin A1C: 8.2 % — AB (ref 4.0–5.6)

## 2024-02-28 MED ORDER — INSULIN LISPRO 100 UNIT/ML IJ SOLN
INTRAMUSCULAR | 4 refills | Status: AC
  Filled 2024-02-28: qty 50, 100d supply, fill #0

## 2024-02-28 MED ORDER — OMNIPOD 5 DEXG7G6 PODS GEN 5 MISC
3 refills | Status: AC
  Filled 2024-02-28: qty 30, 60d supply, fill #0

## 2024-02-28 MED ORDER — DEXCOM G7 SENSOR MISC
3 refills | Status: AC
  Filled 2024-02-28: qty 9, 90d supply, fill #0

## 2024-02-28 NOTE — Progress Notes (Signed)
 "  Outpatient Endocrinology Note Ethan Ledbetter, MD   Ethan Montoya's Name: Ethan Montoya    DOB: 04-28-2000    MRN: [id number]                                                    REASON OF VISIT: New consult for type 1 diabetes mellitus  REFERRING PROVIDER: Verdon Darnel, NP   PCP: Ethan Males, MD  HISTORY OF PRESENT ILLNESS:   Ethan Montoya is a 24 y.o. old male with past medical history listed below, is here for new consult for type 1 diabetes mellitus.   Pertinent Diabetes History: reviewed and updated Ethan Montoya is transitioning pediatric to adult endocrinology care for type 1 diabetes mellitus, initial visit on February 28 2024.  Hemoglobin A1c on initial visit 8.2%.  Ethan Montoya was diagnosed with type 1 diabetes mellitus in June 2018.  Ethan Montoya was initially on multidose insulin  regimen, insulin  pump therapy was started in Fall 2022, OmniPod 5.  Ethan Montoya had insulin  antibodies and GAD 65 antibody positive consistent with having type 1 diabetes mellitus.  History of DKA or diabetes related hospitalizations: none.  Based on record no DKA at the time of initial diagnosis in 2018, had severe hyperglycemia.  Previous diabetes education: Yes   Family h/o diabetes mellitus: mother with type 1 diabetes mellitus.    Latest Reference Range & Units 07/21/16 00:42 07/21/16 02:29  Insulin  Antibodies, Human uU/mL  5.8 (H)  Glutamic Acid Decarb Ab 0.0 - 5.0 U/mL  25.6 (H)  Pancreatic Islet Cell Antibody Neg:<1:1   Negative  C-Peptide 1.1 - 4.4 ng/mL 1.2   (H): Data is abnormally high   Chronic Diabetes Complications : Retinopathy: unknown. Last ophthalmology exam was done on Due,has not established ophthalmology care. Nephropathy: no Peripheral neuropathy: no Coronary artery disease: no Stroke: no    Relevant comorbidities and cardiovascular risk factors: Obesity: no Body mass index is 22.43 kg/m.  Hypertension: no Hyperlipidemia : no   Current / Home Diabetic regimen includes:  OmniPod 5 with  Dexcom G7, using Humalog  U100.  Insulin  Pump setting:  Basal MN- 0.7u/hour 12 PM- 0.75   Bolus CHO Ratio (1unit:CHO) MN- 1:25 9 AM- 1:17 11:30- 1:13 5PM- 1:8 9 PM-   1:9  Correction/Sensitivity: MN- 1:55 12PM- 1:45  Target: 110  Active insulin  time: 3 hours  CONTINUOUS GLUCOSE MONITORING SYSTEM (CGMS) / INSULIN  PUMP INTERPRETATION:                         Tandem Pump & Sensor Download (Reviewed and summarized below.) Pump: Dexcom G6 and Tandem t:slim Dates: January 9 to February 28, 2024, 14 days  Average total daily insulin :  30 units, Basal: 72%, Bolus: 28%  Automated mode 86%       Trends:  Frequent postprandial hyperglycemia, with a blood sugar 300-350 range and some time in low 200s range related to not enough meal bolus, missing meal bolus and high carb meal.  Reviewed mild hypoglycemia related to meals and correctional bolus after eating.  Blood sugar overnight and in between the meals mostly acceptable.  Prior diabetic medications: Multidose insulin  regimen.  Hypoglycemia: Ethan Montoya has minor hypoglycemic episodes. Ethan Montoya has hypoglycemia awareness.  Factors modifying glucose control: 1.  Diabetic diet assessment: 2-3 meals a day.  2.  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  Medication compliance: compliant most of the time.  Interval history  Initial visit today.  Insulin  pump and CGM data as reviewed above.  Ethan Montoya has postprandial hyperglycemia.  Mostly bolusing meals manually without carb count.  Hemoglobin A1c today 8.2%.  Ethan Montoya has Dexcom account to share with his mother, asked to make correction however his pump account on his CGM and download today as above.   REVIEW OF SYSTEMS As per history of present illness.   PAST MEDICAL HISTORY: Past Medical History:  Diagnosis Date   Asthma    Diabetes mellitus without complication (HCC)     PAST SURGICAL HISTORY: Past Surgical History:  Procedure Laterality Date    ROOT CANAL     WISDOM TOOTH EXTRACTION      ALLERGIES: Allergies[1]  FAMILY HISTORY:  Family History  Problem Relation Age of Onset   Healthy Mother    Healthy Father     SOCIAL HISTORY: Social History   Socioeconomic History   Marital status: Single    Spouse name: Not on file   Number of children: Not on file   Years of education: Not on file   Highest education level: Not on file  Occupational History   Not on file  Tobacco Use   Smoking status: Some Days    Types: Cigarettes    Passive exposure: Yes   Smokeless tobacco: Never   Tobacco comments:    Pt smokes  Vaping Use   Vaping status: Every Day   Substances: Nicotine, Flavoring  Substance and Sexual Activity   Alcohol  use: No   Drug use: Never   Sexual activity: Yes  Other Topics Concern   Not on file  Social History Narrative   Works at [employer] alone    Attends [employer]   Social Drivers of Health   Tobacco Use: High Risk (02/28/2024)   Ethan Montoya History    Smoking Tobacco Use: Some Days    Smokeless Tobacco Use: Never    Passive Exposure: Yes  Financial Resource Strain: Not on file  Food Insecurity: Not on file  Transportation Needs: Not on file  Physical Activity: Not on file  Stress: Not on file  Social Connections: Not on file  Depression (PHQ2-9): Medium Risk (11/23/2021)   Depression (PHQ2-9)    PHQ-2 Score: 7  Alcohol  Screen: Not on file  Housing: Not on file  Utilities: Not on file  Health Literacy: Not on file    MEDICATIONS:  Current Outpatient Medications  Medication Sig Dispense Refill   cyclobenzaprine  (FLEXERIL ) 10 MG tablet Take 1 tablet (10 mg total) by mouth 2 (two) times daily as needed for muscle spasms. Do not take with alcohol  or while driving or operating heavy machinery.  May cause drowsiness. 20 tablet 0   FLUoxetine  (PROZAC ) 20 MG capsule Take 1 capsule (20 mg total) by mouth daily. 30 capsule 2   FLUoxetine  (PROZAC ) 40 MG capsule Take 1 capsule (40 mg  total) by mouth daily. 90 capsule 0   Insulin  Disposable Pump (OMNIPOD 5 G6 INTRO, GEN 5,) KIT Inject 1 Device into the skin as directed. Change pod every 2 days. This will be a 30 day supply. Please fill for [facility] [id number] 1 kit 1   insulin  glargine (LANTUS  SOLOSTAR) 100 UNIT/ML Solostar Pen Inject up to 50 units per day as directed. Please keep on file for back up in case insulin  pump malfunctions. 15 mL 3   ondansetron  (ZOFRAN ) 4 MG tablet  Take 1 tablet (4 mg total) by mouth every 6 (six) hours. 12 tablet 0   propranolol  (INDERAL ) 10 MG tablet Take 1 tablet (10 mg total) by mouth 2 (two) times daily as needed. 60 tablet 2   Continuous Glucose Sensor (DEXCOM G7 SENSOR) MISC Use 1 sensor as directed every 10 days to monitor glucose continuously. 9 each 3   Insulin  Disposable Pump (OMNIPOD 5 DEXG7G6 PODS GEN 5) MISC INJECT 1 DEVICE INTO THE SKIN AS DIRECTED. CHANGE POD EVERY 2 DAYS. 30 each 3   insulin  lispro (HUMALOG ) 100 UNIT/ML injection Use up to 50 units/day via insulin  pump. 50 mL 4   No current facility-administered medications for this visit.    PHYSICAL EXAM: Vitals:   02/28/24 1003  BP: 128/70  Pulse: 87  Resp: 16  SpO2: 99%  Weight: 160 lb 12.8 oz (72.9 kg)  Height: 5' 11 (1.803 m)   Body mass index is 22.43 kg/m.  Wt Readings from Last 3 Encounters:  02/28/24 160 lb 12.8 oz (72.9 kg)  07/11/23 150 lb (68 kg)  05/09/23 145 lb 1.6 oz (65.8 kg)    General: Well developed, well nourished male in no apparent distress.  HEENT: AT/[place], no external lesions.  Eyes: Conjunctiva clear and no icterus. Neck: Neck supple  Lungs: Respirations not labored Neurologic: Alert, oriented, normal speech Extremities / Skin: Dry.  Psychiatric: Does not appear depressed or anxious  Diabetic Foot Exam - Simple   No data filed    LABS Reviewed Lab Results  Component Value Date   HGBA1C 8.2 (A) 02/28/2024   HGBA1C 7.4 (A) 05/09/2023   HGBA1C 7.5 (A) 10/24/2022   No results found  for: FRUCTOSAMINE Lab Results  Component Value Date   CHOL 188 06/01/2022   HDL 41 06/01/2022   LDLCALC 132 (H) 06/01/2022   TRIG 59 06/01/2022   CHOLHDL 4.6 06/01/2022   Lab Results  Component Value Date   MICRALBCREAT 6 06/01/2022   MICRALBCREAT 5 04/21/2021   Lab Results  Component Value Date   CREATININE 0.94 06/01/2022   No results found for: GFR  ASSESSMENT / PLAN  1. Type 1 diabetes mellitus with hyperglycemia (HCC)   2. Uncontrolled type 1 diabetes mellitus with hyperglycemia (HCC)     Diabetes Mellitus type 1, complicated by no known other complication. - Diabetic status / severity: Uncontrolled, worsening.  Lab Results  Component Value Date   HGBA1C 8.2 (A) 02/28/2024    - Hemoglobin A1c goal <6.5%   Discussed about type 1 diabetes mellitus, importance of controlling diabetes, potential chronic diabetic complications including diabetic retinopathy, neuropathy and nephropathy.  Ethan Montoya has postprandial hyperglycemia, Ethan Montoya has been bolusing for meals mostly manually without using carb count/bolus wizard.  Advised Ethan Montoya to use carb counting and bolus wizard for bolusing for meals and correcting hyperglycemia.  Advised to bolus for meal before eating.  Changed pump setting as follows.  -MEDICATIONS:  Insulin  pump setting changed as follows:  Insulin  Pump setting:  OmniPod 5 with Dexcom G7, using Humalog  U100.  Basal MN- 0.7u/hour 12 PM- 0.75   Bolus CHO Ratio (1unit:CHO) MN- 1:25, changed to 1 :15 9 AM- 1:17, changed to 1: 10 11:30- 1:13 5PM- 1:8 9 PM-        1:9  Correction/Sensitivity: MN- 1:55, changed to 1: 45  12PM- 1:45  Target: 110  Active insulin  time: 3 hours  - Home glucose testing: continue CGM and check blood glucose as needed.  - Discussed/ Gave Hypoglycemia treatment plan.  #  Consult : not required at this time.   # Annual urine for microalbuminuria/ creatinine ratio, no microalbuminuria currently.  Will check today along  with BMP. Last  Lab Results  Component Value Date   MICRALBCREAT 6 06/01/2022    # Foot check nightly.  # Annual dilated diabetic eye exams.  Refer to ophthalmology for diabetic eye exam.  - Diet: Make healthy diabetic food choices - Life style / activity / exercise: Discussed.  2. Blood pressure  -  BP Readings from Last 1 Encounters:  02/28/24 128/70    - Control is in target.  - No change in current plans.  3. Lipid status / Hyperlipidemia - Last  Lab Results  Component Value Date   LDLCALC 132 (H) 06/01/2022   - No indication of statin at this time.  Diagnoses and all orders for this visit:  Type 1 diabetes mellitus with hyperglycemia (HCC) -     POCT glycosylated hemoglobin (Hb A1C) -     Basic metabolic panel with GFR -     Microalbumin / creatinine urine ratio -     Ambulatory referral to Ophthalmology -     Continuous Glucose Sensor (DEXCOM G7 SENSOR) MISC; Use 1 sensor as directed every 10 days to monitor glucose continuously. -     insulin  lispro (HUMALOG ) 100 UNIT/ML injection; Use up to 50 units/day via insulin  pump. -     Insulin  Disposable Pump (OMNIPOD 5 DEXG7G6 PODS GEN 5) MISC; INJECT 1 DEVICE INTO THE SKIN AS DIRECTED. CHANGE POD EVERY 2 DAYS.  Uncontrolled type 1 diabetes mellitus with hyperglycemia (HCC)    DISPOSITION Follow up in clinic in 3 months suggested.   All questions answered and Ethan Montoya verbalized understanding of the plan.  Farid Grigorian, MD [facility] End[facility] [address] [place], [address] [place], [place] [address] Phone # [phone]  At least part of this note was generated using voice recognition software. Inadvertent word errors may have occurred, which were not recognized during the proofreading process.     [1] No Known Allergies  "

## 2024-02-29 LAB — BASIC METABOLIC PANEL WITH GFR
BUN: 15 mg/dL (ref 7–25)
CO2: 28 mmol/L (ref 20–32)
Calcium: 10.1 mg/dL (ref 8.6–10.3)
Chloride: 103 mmol/L (ref 98–110)
Creat: 1.01 mg/dL (ref 0.60–1.24)
Glucose, Bld: 99 mg/dL (ref 65–99)
Potassium: 4.1 mmol/L (ref 3.5–5.3)
Sodium: 141 mmol/L (ref 135–146)
eGFR: 108 mL/min/1.73m2

## 2024-02-29 LAB — MICROALBUMIN / CREATININE URINE RATIO
Creatinine, Urine: 196 mg/dL (ref 20–320)
Microalb Creat Ratio: 4 mg/g{creat}
Microalb, Ur: 0.8 mg/dL

## 2024-03-10 ENCOUNTER — Other Ambulatory Visit (HOSPITAL_COMMUNITY): Payer: Self-pay

## 2024-03-11 ENCOUNTER — Ambulatory Visit: Admitting: Endocrinology

## 2024-03-11 ENCOUNTER — Other Ambulatory Visit (HOSPITAL_COMMUNITY): Payer: Self-pay

## 2024-03-13 ENCOUNTER — Other Ambulatory Visit (HOSPITAL_COMMUNITY): Payer: Self-pay

## 2024-03-13 ENCOUNTER — Encounter (HOSPITAL_COMMUNITY): Payer: Self-pay | Admitting: Psychiatry

## 2024-03-13 ENCOUNTER — Telehealth (HOSPITAL_COMMUNITY): Admitting: Psychiatry

## 2024-03-13 DIAGNOSIS — F331 Major depressive disorder, recurrent, moderate: Secondary | ICD-10-CM

## 2024-03-13 DIAGNOSIS — F411 Generalized anxiety disorder: Secondary | ICD-10-CM

## 2024-03-13 MED ORDER — FLUOXETINE HCL 20 MG PO CAPS
20.0000 mg | ORAL_CAPSULE | Freq: Every day | ORAL | 2 refills | Status: AC
  Filled 2024-03-13: qty 30, 30d supply, fill #0

## 2024-03-13 MED ORDER — FLUOXETINE HCL 40 MG PO CAPS
40.0000 mg | ORAL_CAPSULE | Freq: Every day | ORAL | 0 refills | Status: AC
  Filled 2024-03-13: qty 90, 90d supply, fill #0

## 2024-03-14 ENCOUNTER — Other Ambulatory Visit: Payer: Self-pay

## 2024-03-14 ENCOUNTER — Other Ambulatory Visit (HOSPITAL_COMMUNITY): Payer: Self-pay

## 2024-06-06 ENCOUNTER — Ambulatory Visit: Admitting: Endocrinology

## 2024-06-12 ENCOUNTER — Telehealth (HOSPITAL_COMMUNITY): Admitting: Psychiatry
